# Patient Record
Sex: Female | Born: 1960 | Race: White | Hispanic: No | Marital: Married | Smoking: Current every day smoker
Health system: Southern US, Community
[De-identification: ages and names within clinical notes are randomized; demographics above are authoritative.]

## PROBLEM LIST (undated history)

## (undated) DIAGNOSIS — G40909 Epilepsy, unspecified, not intractable, without status epilepticus: Secondary | ICD-10-CM

## (undated) DIAGNOSIS — E039 Hypothyroidism, unspecified: Secondary | ICD-10-CM

## (undated) DIAGNOSIS — D649 Anemia, unspecified: Secondary | ICD-10-CM

## (undated) DIAGNOSIS — E079 Disorder of thyroid, unspecified: Secondary | ICD-10-CM

## (undated) DIAGNOSIS — F419 Anxiety disorder, unspecified: Secondary | ICD-10-CM

## (undated) DIAGNOSIS — R002 Palpitations: Secondary | ICD-10-CM

## (undated) DIAGNOSIS — J449 Chronic obstructive pulmonary disease, unspecified: Secondary | ICD-10-CM

## (undated) DIAGNOSIS — K219 Gastro-esophageal reflux disease without esophagitis: Secondary | ICD-10-CM

## (undated) HISTORY — DX: Epilepsy, unspecified, not intractable, without status epilepticus: G40.909

## (undated) HISTORY — DX: Disorder of thyroid, unspecified: E07.9

## (undated) HISTORY — DX: Palpitations: R00.2

## (undated) HISTORY — PX: OTHER SURGICAL HISTORY: SHX169

## (undated) HISTORY — PX: GASTRIC BYPASS: SHX52

## (undated) HISTORY — PX: THYROID SURGERY: SHX805

---

## 2001-07-14 ENCOUNTER — Ambulatory Visit (HOSPITAL_COMMUNITY): Admission: RE | Admit: 2001-07-14 | Discharge: 2001-07-14 | Payer: Self-pay | Admitting: Family Medicine

## 2001-07-14 ENCOUNTER — Encounter: Payer: Self-pay | Admitting: Family Medicine

## 2002-02-02 ENCOUNTER — Encounter (HOSPITAL_COMMUNITY): Admission: RE | Admit: 2002-02-02 | Discharge: 2002-03-04 | Payer: Self-pay | Admitting: *Deleted

## 2002-09-20 ENCOUNTER — Emergency Department (HOSPITAL_COMMUNITY): Admission: EM | Admit: 2002-09-20 | Discharge: 2002-09-20 | Payer: Self-pay | Admitting: *Deleted

## 2003-06-06 ENCOUNTER — Emergency Department (HOSPITAL_COMMUNITY): Admission: EM | Admit: 2003-06-06 | Discharge: 2003-06-06 | Payer: Self-pay | Admitting: Emergency Medicine

## 2003-06-07 ENCOUNTER — Ambulatory Visit (HOSPITAL_COMMUNITY): Admission: RE | Admit: 2003-06-07 | Discharge: 2003-06-07 | Payer: Self-pay | Admitting: Family Medicine

## 2003-06-08 ENCOUNTER — Ambulatory Visit (HOSPITAL_COMMUNITY): Admission: RE | Admit: 2003-06-08 | Discharge: 2003-06-08 | Payer: Self-pay | Admitting: Family Medicine

## 2003-09-29 ENCOUNTER — Ambulatory Visit (HOSPITAL_COMMUNITY): Admission: RE | Admit: 2003-09-29 | Discharge: 2003-09-29 | Payer: Self-pay | Admitting: Family Medicine

## 2004-02-21 ENCOUNTER — Ambulatory Visit (HOSPITAL_COMMUNITY): Admission: RE | Admit: 2004-02-21 | Discharge: 2004-02-21 | Payer: Self-pay | Admitting: *Deleted

## 2004-05-09 ENCOUNTER — Ambulatory Visit (HOSPITAL_COMMUNITY): Admission: RE | Admit: 2004-05-09 | Discharge: 2004-05-09 | Payer: Self-pay | Admitting: Family Medicine

## 2004-06-27 ENCOUNTER — Ambulatory Visit: Payer: Self-pay | Admitting: Internal Medicine

## 2004-06-30 ENCOUNTER — Emergency Department (HOSPITAL_COMMUNITY): Admission: EM | Admit: 2004-06-30 | Discharge: 2004-06-30 | Payer: Self-pay | Admitting: Emergency Medicine

## 2004-07-04 ENCOUNTER — Ambulatory Visit (HOSPITAL_COMMUNITY): Admission: RE | Admit: 2004-07-04 | Discharge: 2004-07-04 | Payer: Self-pay | Admitting: Family Medicine

## 2004-07-12 ENCOUNTER — Ambulatory Visit (HOSPITAL_COMMUNITY): Admission: RE | Admit: 2004-07-12 | Discharge: 2004-07-12 | Payer: Self-pay | Admitting: Internal Medicine

## 2004-07-12 ENCOUNTER — Ambulatory Visit: Payer: Self-pay | Admitting: Internal Medicine

## 2004-07-17 ENCOUNTER — Ambulatory Visit (HOSPITAL_COMMUNITY): Admission: RE | Admit: 2004-07-17 | Discharge: 2004-07-17 | Payer: Self-pay | Admitting: Obstetrics & Gynecology

## 2004-07-31 ENCOUNTER — Ambulatory Visit (HOSPITAL_COMMUNITY): Admission: RE | Admit: 2004-07-31 | Discharge: 2004-07-31 | Payer: Self-pay | Admitting: General Surgery

## 2005-05-18 ENCOUNTER — Ambulatory Visit (HOSPITAL_COMMUNITY): Admission: RE | Admit: 2005-05-18 | Discharge: 2005-05-18 | Payer: Self-pay | Admitting: *Deleted

## 2005-09-07 ENCOUNTER — Emergency Department (HOSPITAL_COMMUNITY): Admission: EM | Admit: 2005-09-07 | Discharge: 2005-09-07 | Payer: Self-pay | Admitting: Emergency Medicine

## 2006-04-16 ENCOUNTER — Emergency Department (HOSPITAL_COMMUNITY): Admission: EM | Admit: 2006-04-16 | Discharge: 2006-04-16 | Payer: Self-pay | Admitting: Emergency Medicine

## 2007-01-09 HISTORY — PX: GASTRIC BYPASS: SHX52

## 2007-02-06 ENCOUNTER — Emergency Department (HOSPITAL_COMMUNITY): Admission: EM | Admit: 2007-02-06 | Discharge: 2007-02-06 | Payer: Self-pay | Admitting: Emergency Medicine

## 2007-08-29 ENCOUNTER — Emergency Department (HOSPITAL_COMMUNITY): Admission: EM | Admit: 2007-08-29 | Discharge: 2007-08-29 | Payer: Self-pay | Admitting: Emergency Medicine

## 2007-08-30 ENCOUNTER — Emergency Department (HOSPITAL_COMMUNITY): Admission: EM | Admit: 2007-08-30 | Discharge: 2007-08-30 | Payer: Self-pay | Admitting: Emergency Medicine

## 2007-09-04 ENCOUNTER — Emergency Department (HOSPITAL_COMMUNITY): Admission: EM | Admit: 2007-09-04 | Discharge: 2007-09-04 | Payer: Self-pay | Admitting: Emergency Medicine

## 2009-02-14 ENCOUNTER — Ambulatory Visit (HOSPITAL_COMMUNITY): Admission: RE | Admit: 2009-02-14 | Discharge: 2009-02-14 | Payer: Self-pay | Admitting: Family Medicine

## 2010-03-02 ENCOUNTER — Emergency Department (HOSPITAL_COMMUNITY)
Admission: EM | Admit: 2010-03-02 | Discharge: 2010-03-02 | Disposition: A | Discharge status: Home / Self Care | Payer: BC Managed Care – PPO | Attending: Emergency Medicine | Admitting: Emergency Medicine

## 2010-03-02 ENCOUNTER — Emergency Department (HOSPITAL_COMMUNITY): Payer: BC Managed Care – PPO

## 2010-03-02 DIAGNOSIS — R109 Unspecified abdominal pain: Secondary | ICD-10-CM | POA: Insufficient documentation

## 2010-03-02 DIAGNOSIS — R0789 Other chest pain: Secondary | ICD-10-CM | POA: Insufficient documentation

## 2010-03-02 DIAGNOSIS — M545 Low back pain, unspecified: Secondary | ICD-10-CM | POA: Insufficient documentation

## 2010-03-02 DIAGNOSIS — R209 Unspecified disturbances of skin sensation: Secondary | ICD-10-CM | POA: Insufficient documentation

## 2010-03-02 LAB — BASIC METABOLIC PANEL
BUN: 13 mg/dL (ref 6–23)
Creatinine, Ser: 0.76 mg/dL (ref 0.4–1.2)
GFR calc Af Amer: >60 mL/min (ref >60)
GFR calc non Af Amer: >60 mL/min (ref >60)

## 2010-03-02 LAB — POCT CARDIAC MARKERS
CKMB, poc: 1.1 ng/mL (ref 1.0–8.0)
Myoglobin, poc: 50.6 ng/mL (ref 12–200)

## 2010-03-02 LAB — DIFFERENTIAL
Basophils Relative: 1 % (ref 0–1)
Eosinophils Relative: 6 % — ABNORMAL HIGH (ref 0–5)
Lymphocytes Relative: 22 % (ref 12–46)
Monocytes Absolute: 0.4 10*3/uL (ref 0.1–1.0)
Monocytes Relative: 7 % (ref 3–12)
Neutro Abs: 3.9 10*3/uL (ref 1.7–7.7)

## 2010-03-02 LAB — CBC
MCH: 29.1 pg (ref 26.0–34.0)
MCV: 87.1 fL (ref 78.0–100.0)
Platelets: 225 10*3/uL (ref 150–400)
RBC: 4.43 MIL/uL (ref 3.87–5.11)
RDW: 13.4 % (ref 11.5–15.5)

## 2010-03-02 LAB — URINALYSIS, ROUTINE W REFLEX MICROSCOPIC
Urine Glucose, Fasting: NEGATIVE mg/dL
Urobilinogen, UA: 0.2 mg/dL (ref 0.0–1.0)

## 2010-03-02 LAB — GLUCOSE, CAPILLARY: Glucose-Capillary: 106 mg/dL — ABNORMAL HIGH (ref 70–99)

## 2010-05-02 ENCOUNTER — Emergency Department (HOSPITAL_COMMUNITY): Payer: BC Managed Care – PPO

## 2010-05-02 ENCOUNTER — Emergency Department (HOSPITAL_COMMUNITY)
Admission: EM | Admit: 2010-05-02 | Discharge: 2010-05-02 | Disposition: A | Discharge status: Home / Self Care | Payer: BC Managed Care – PPO | Attending: Emergency Medicine | Admitting: Emergency Medicine

## 2010-05-02 DIAGNOSIS — Z9884 Bariatric surgery status: Secondary | ICD-10-CM | POA: Insufficient documentation

## 2010-05-02 DIAGNOSIS — S82899A Other fracture of unspecified lower leg, initial encounter for closed fracture: Secondary | ICD-10-CM | POA: Insufficient documentation

## 2010-05-02 DIAGNOSIS — E039 Hypothyroidism, unspecified: Secondary | ICD-10-CM | POA: Insufficient documentation

## 2010-05-02 DIAGNOSIS — W010XXA Fall on same level from slipping, tripping and stumbling without subsequent striking against object, initial encounter: Secondary | ICD-10-CM | POA: Insufficient documentation

## 2010-05-02 DIAGNOSIS — M2390 Unspecified internal derangement of unspecified knee: Secondary | ICD-10-CM | POA: Insufficient documentation

## 2010-05-02 DIAGNOSIS — G40909 Epilepsy, unspecified, not intractable, without status epilepticus: Secondary | ICD-10-CM | POA: Insufficient documentation

## 2010-05-02 DIAGNOSIS — Y998 Other external cause status: Secondary | ICD-10-CM | POA: Insufficient documentation

## 2010-05-02 DIAGNOSIS — Z79899 Other long term (current) drug therapy: Secondary | ICD-10-CM | POA: Insufficient documentation

## 2010-05-02 DIAGNOSIS — Y9301 Activity, walking, marching and hiking: Secondary | ICD-10-CM | POA: Insufficient documentation

## 2010-05-04 ENCOUNTER — Encounter: Payer: Self-pay | Admitting: Orthopedic Surgery

## 2010-05-04 ENCOUNTER — Ambulatory Visit (INDEPENDENT_AMBULATORY_CARE_PROVIDER_SITE_OTHER): Payer: BC Managed Care – PPO | Admitting: Orthopedic Surgery

## 2010-05-04 VITALS — HR 62 | Resp 16 | Ht 64.0 in | Wt 170.0 lb

## 2010-05-04 DIAGNOSIS — IMO0002 Reserved for concepts with insufficient information to code with codable children: Secondary | ICD-10-CM

## 2010-05-04 DIAGNOSIS — S8390XA Sprain of unspecified site of unspecified knee, initial encounter: Secondary | ICD-10-CM

## 2010-05-04 DIAGNOSIS — S8253XA Displaced fracture of medial malleolus of unspecified tibia, initial encounter for closed fracture: Secondary | ICD-10-CM

## 2010-05-04 NOTE — Progress Notes (Signed)
50 year old female fell down a hill on April 24.  Into the ER. X-rays were taken of the knee and ankle. No acute injury other than the medial malleolar transverse fracture line, consistent with medial malleolar fracture.  She has 6/10. Intermittent pain, which is better when she is not walking, worse when she is walking. She is on Norco 5 mg, which helps. Denies numbness or tingling. She has some pain in the RIGHT deltoid. Denies frank shoulder pain.  Review of systems heartburn, heat and cold intolerance. No chest pain or shortness of breath.  Allergic to sulfa, penicillin shellfish.  Currently, taking phenobarbital Synthroid 8 vitamins and stool softener.  Primary care physician Dr. Phillips Odor.  History of gastric bypass surgery in 2009, surgery for "tumors"  Family history heart and lung disease, diabetes, and cancer.  Patient married.  Housewife.  11th grade was completed.  General appearance medium to large, frame she's oriented x3. Mood and affect are normal. She is ambulating with a knee immobilizer an obvious limp, favoring the RIGHT lower extremity. Her upper extremities show no contracture subluxation, atrophy, tremor or swelling.  The RIGHT knee is nontender with stable ligaments. There is no effusion is 90 of knee flexion after being in knee immobilizer.  She's tender over the medial malleolus with swelling of the ankle. She has normal ankle range of motion. Ankle is stable. There is an abrasion over the skin of the ankle.  Skin over the knee is normal.  Pulse and temperature of the RIGHT lower extremity are normal. Sensation is normal. Pathologic reflexes none.  Coordination and balance normal.  Hospital x-rays again are negative. Disagrees with a report as dictated.  Impression sprain, RIGHT knee seems to be improving recommend hinged knee brace.  Medial malleolar fracture.  Recommend Aircast for weightbearing x-ray in 4 weeks expect 8 weeks to heal.  Patient  will follow up in 4 weeks for x-ray out of plaster and reevaluation of her RIGHT knee.

## 2010-05-05 ENCOUNTER — Telehealth: Payer: Self-pay | Admitting: *Deleted

## 2010-05-05 NOTE — Telephone Encounter (Signed)
Called stating her neck and head is hurting, feeling nauseated, advised to call PCP, we have not seen her for these problems before, advised Dr. Rexene Edison would not be in until Monday afternoon

## 2010-05-26 NOTE — Op Note (Signed)
NAME:  Gabriella Hodges, Gabriella Hodges              ACCOUNT NO.:  [id number]   MEDICAL RECORD NO.:  [id number]          PATIENT TYPE:  AMB   LOCATION:  DAY                           FACILITY:  [facility]   PHYSICIAN:  Dalia Heading, M.D.  DATE OF BIRTH:  09/20/1960   DATE OF PROCEDURE:  07/31/2004  DATE OF DISCHARGE:                                 OPERATIVE REPORT   PREOPERATIVE DIAGNOSIS:  Incarcerated umbilical hernia.   POSTOPERATIVE DIAGNOSIS:  Incarcerated umbilical hernia.   PROCEDURE:  Incarcerated umbilical herniorrhaphy.   SURGEON:  Dalia Heading, M.D.   ANESTHESIA:  General endotracheal.   INDICATIONS:  The patient is a 50 year old morbidly obese white female who  is referred for treatment of an incarcerated umbilical hernia.  Risks and  benefits of procedure including bleeding, infection, recurrence of the  hernia were fully explained to the patient, who gave informed consent.   PROCEDURE NOTE:  The patient was placed in supine position.  After induction  of general endotracheal anesthesia, the abdomen was prepped and draped in  the usual sterile technique with Betadine.  Surgical site confirmation  performed.   An infraumbilical incision was made down to the base of the umbilicus. The  umbilicus was freed away from the umbilical hernia sac.  Omentum was noted  to be incarcerated within the hernia.  The hernia itself measured only  approximately 2-3 cm at its greatest diameter.  Part of the omentum was  incarcerated was resected. The remaining omentum was then reduced. The  hernia defect was then closed using O Ethibond interrupted sutures in a two-  layer fashion.  The subcutaneous base of the umbilicus was secured to the  fascia using a 2-0 Vicryl interrupted suture.  The subcutaneous layer was  reapproximated using 2-0 and 3-0 Vicryl interrupted sutures.  Skin was  closed using staples, and 0.5% Sensorcaine was instilled in the surrounding  wound.  Betadine ointment and dry  sterile dressing were applied.   All tape and needle counts were correct an the end of the procedure.  The  patient was extubated in the operating room and went back to the recovery  room awake in stable condition.   COMPLICATIONS:  None.   SPECIMEN:  None.   BLOOD LOSS:  Minimal.       MAJ/MEDQ  D:  07/31/2004  T:  07/31/2004  Job:  [id number]   cc:   Patrica Duel, M.D.  [address], [address]  [place]  [place] [address]  Fax: [phone]

## 2010-05-26 NOTE — Op Note (Signed)
NAME:  Gabriella Hodges, Gabriella Hodges NO.:  [id number]   MEDICAL RECORD NO.:  [id number]                  PATIENT TYPE:   LOCATION:                                       FACILITY:   PHYSICIAN:  Vida Roller, M.D.                DATE OF BIRTH:  Sep 02, 1960   DATE OF PROCEDURE:  02/02/2002  DATE OF DISCHARGE:                                  PROCEDURE NOTE   REFERRING PHYSICIAN:  Patrica Duel, M.D.   INDICATIONS FOR PROCEDURE:  This is a morbidly obese white female with  severe shortness of breath with exertion.   IMPRESSION:  1. Nondiagnostic stress myocardial perfusion imaging study with Adenosine.  2. Mildly abnormal nuclear perfusion imaging study with a fixed inferior     defect from base to apex, moderate in severity likely representing     diaphragmatic attenuation.  3. Preserved left ventricular ejection fraction at 57%.   DOSE:  1. 10 millicuries of technetium 99 Cardiolite was injected at rest.  2. 30 millicuries of technetium 1 M Cardiolite was injected at peak     pharmacologic stress.   PROCEDURE:  Following rest induction of low dose technetium with 99 tracer.  SPECT imaging of the chest was performed just prior to the achievement of  peak pharmacologic stress during the infusion of Adenosine over a four  minute protocol during stress EKG examination.  The patient was administered  technetium 18 M Cardiolite intravenously.  Post stress imaging of the chest  was performed using single photon emission computed tomography with wall  motion assessment and gating to obtain a left ventricular ejection fraction.   FINDINGS:  Stress images of the chest reveal a moderate defect in the  inferior wall from base to apex as well as an apical defect which is mild,  both of which are fixed and likely represent attenuation defect either from  the abdominal pannus or the diaphragm or both.  Post stress images of the  chest gated reveal an ejection fraction  of 57% with no obvious wall motion  abnormalities which lends further credence to the likelihood this is  attenuation artifact and not actual infarct.  There is no evidence of  ischemia.   STRESS TESTING:  The patient was given an infusion of Adenosine.  She  received a total of 120 mg infused over four minutes.  She experienced no  significant symptoms other than mild chest tightness and had no ST-T wave  changes associated with the infusion.  Her blood pressure response showed a  resting systolic blood pressure of 122 which did not change.  However, her  heart rate went from 83 at rest to 116 at peak pharmacologic stress.    IMPRESSION:  This is a patient who is 5 feet 5 inches tall and weighs 338  pounds and therefore has a significant likelihood for having attenuation  even with high energy tracer  technetium.  The wall motion assessment lends a  great deal of credence to the likelihood that this is not ischemia or  infarction, however, these etiology cannot be ruled out.                                               Vida Roller, M.D.    JH/MEDQ  D:  02/03/2002  T:  02/03/2002  Job:  [id number]

## 2010-05-26 NOTE — H&P (Signed)
NAME:  Gabriella Hodges, Gabriella Hodges              ACCOUNT NO.:  [id number]   MEDICAL RECORD NO.:  [id number]          PATIENT TYPE:  AMB   LOCATION:  DAY                           FACILITY:  [facility]   PHYSICIAN:  Dalia Heading, M.D.  DATE OF BIRTH:  [birth year]   DATE OF ADMISSION:  DATE OF DISCHARGE:  LH                                HISTORY & PHYSICAL   CHIEF COMPLAINT:  Umbilical hernia.   HISTORY OF PRESENT ILLNESS:  The patient is a 50 year old morbidly obese  white female who is referred for evaluation and treatment of an umbilical  hernia.  The pain radiates down to the right lower flank.  She had a  colonoscopy five years ago by Dr. Karilyn Cota that was reportedly unremarkable.  She had a recent CT scan of the abdomen which only showed an umbilical  hernia.   PAST MEDICAL HISTORY:  1.  Epilepsy.  2.  Hypothyroidism.   PAST SURGICAL HISTORY:  1.  Cholecystectomy.  2.  Thyroid surgery.   CURRENT MEDICATIONS:  1.  Phenobarbital.  2.  Synthroid.  3.  Oxycodone.   ALLERGIES:  No known drug allergies.   SOCIAL HISTORY:  The patient denies any significant alcohol use.  She does  smoke on occasion.   PHYSICAL EXAMINATION:  GENERAL APPEARANCE:  The patient is a morbidly obese  white female in no acute distress.  She is afebrile and in no acute  distress.  VITAL SIGNS:  Afebrile and vital signs are stable.  LUNGS:  Clear to auscultation with good breath sounds bilaterally.  HEART:  Regular rate and rhythm without S3, S4 or murmurs.  ABDOMEN:  Soft, nondistended.  She does have an umbilical hernia which is  difficult to reduce.  No hepatosplenomegaly or masses are noted.  No  inguinal hernias are noted.   IMPRESSION:  Incarcerated umbilical hernia.   PLAN:  The patient is scheduled to undergo an umbilical herniorrhaphy on  July 31, 2004.  Risks, benefits of the procedure including bleeding,  infection and recurrence of the hernia as well as recurrence of the right  lower quadrant  abdominal pain were fully explained to the patient.  Gave  informed consent.       MAJ/MEDQ  D:  07/27/2004  T:  07/27/2004  Job:  [id number]   cc:   Patrica Duel, M.D.  [address]  [place]  [place] [address]  Fax: [phone]

## 2010-05-26 NOTE — Consult Note (Signed)
NAME:  Gabriella Hodges, Gabriella Hodges              ACCOUNT NO.:  [id number]   MEDICAL RECORD NO.:  [id number]          PATIENT TYPE:  AMB   LOCATION:  DAY                           FACILITY:  [facility]   PHYSICIAN:  Lionel December, M.D.    DATE OF BIRTH:  05/11/1960   DATE OF CONSULTATION:  06/27/2004  DATE OF DISCHARGE:                                   CONSULTATION   CHIEF COMPLAINT:  Abdominal pain, nausea.   REFERRING PHYSICIAN:  Patrica Duel, M.D.   HISTORY OF PRESENT ILLNESS:  Gabriella Hodges is a 50 year old morbidly obese Caucasian  female, patient of Dr. Patrica Duel, who presents today for further  evaluation of above stated symptoms.  In addition, she states she has had  dysphagia for many years.  She has difficulty swallowing solids and liquids.  She feels like it is getting caught in her throat.  She notes it has been  there since she has had partial thyroidectomy.  She feels like she has to  clear her throat all the time.  She also complains of hoarseness.  She  denies any typical heartburn symptoms.  She has nausea, but no vomiting.  She has epigastric burning, especially postprandially.  She usually has two  to three liquid to watery bowel movements a day.  Stools are usually yellow.  No melena or rectal bleeding.  She has been on Nexium and Aciphex before  which helped her nausea, but she said she cannot afford the medication.  She  complains of intermittent right-sided abdominal pain, but it unsure of any  modifying or alleviating factors.  She feels like her right abdomen is  larger than the left.  She had an upper GI series on May 09, 2004, which  revealed a moderate sized hiatal hernia with episodes of spontaneous  gastroesophageal reflux.  She had an abdominal ultrasound which revealed  status post cholecystectomy, probable mild fatty infiltration of the liver.  Otherwise, not well seen due to her body habitus.  She had a CBC, LFT's, and  met-7, which were all normal.  H. pylori serologies  were negative at 0.9.   CURRENT MEDICATIONS:  1.  Synthroid 0.05 mg daily.  2.  Phenobarbital 100 mg t.i.d.  3.  Tylenol Arthritis p.r.n.  4.  Nexium 40 mg daily when she has samples.   ALLERGIES:  TYLENOL SINUS.   PAST MEDICAL HISTORY:  1.  History of multiple thyroid nodules, status post left lobectomy and 2/3      of her right thyroid lobe removed.  She is on supplements now.  2.  Gastroesophageal reflux disease with moderate sized hiatal hernia.  3.  Seizure disorder.  4.  Fatty infiltration of the liver.  5.  She had a colonoscopy in 1996, which revealed hemorrhoids.  EGD in 1982,      revealed bile gastritis.  6.  Cholecystectomy in 1979.  7.  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   FAMILY HISTORY:  Mother has thyroid cancer.  Father had heart disease.  She  has a sister with breast cancer.  No family history of colorectal cancer.   SOCIAL HISTORY:  She has a 19 year old son and a 32-year-old daughter.  She  is married.  She quit smoking eight years ago.   REVIEW OF SYSTEMS:  See HPI for GI.  CARDIOPULMONARY:  Denies any chest pain  or shortness of breath.  CONSTITUTIONAL:  Denies any weight loss.   PHYSICAL EXAMINATION:  VITAL SIGNS:  Weight, according to the patient, is  342.  Height is 5 feet 5 inches.  Temperature 98, blood pressure 124/68,  pulse 74.  GENERAL:  A pleasant, morbidly obese Caucasian female in no acute distress.  SKIN:  Warm and dry, no jaundice.  HEENT:  Conjunctivae are pink, sclerae nonicteric.  Oropharyngeal mucosa  moist and pink.  No lesions, erythema, or exudates.  NECK:  No lymphadenopathy or thyromegaly.  She has a prominent scar in the  anterior cervical neck.  CHEST:  Lungs are clear to auscultation.  CARDIAC:  Regular rate and rhythm, no murmurs, rubs, or gallops.  ABDOMEN:  Positive bowel sounds, obese, but symmetrical.  She has mild  epigastric tenderness to deep palpation.  No organomegaly or masses   appreciated, but very limited due to body habitus.  EXTREMITIES:  1+ pitting edema bilaterally around the calves.   IMPRESSION:  Gabriella Hodges is a 50 year old lady with chronic dysphagia both to  solids and liquids since her partial thyroidectomy 10 years ago.  Recent  upper gastrointestinal series did not reveal any esophageal strictures or  peptic ulcer disease.  She does have a moderate sized hiatal hernia.  It is  possible that her dysphagia is due to her prior surgery.  She could also  have this sensation due to poorly controlled acid reflux disease.  She is  intermittently on proton pump inhibitor therapy.  I offered her today an  upper endoscopy for further evaluation of her upper gastrointestinal tract  and to exclude small erosions as well which may not be picked up on upper  gastrointestinal series.  She may benefit from esophageal dilatation, but  she is aware that there is a possibility that this makes no difference.  She  would like to give it a try, however.  In addition, she has epigastric right-  sided abdominal pain which is chronic in nature.  The right-sided abdominal  pain does not seem to always be related to meals as is the epigastric pain.  She also has chronic diarrhea, and Dr. Karilyn Cota previously felt she had  irritable bowel syndrome.  It may be that much of her abdominal pain is  explained by irritable bowel syndrome.   PLAN:  1.  Esophagogastroduodenoscopy with possible dilatation in the near future.  2.  Zegreg 40 mg daily, #20 samples.  3.  Trial of ____________one sublingual q.a.c. and q.h.s., #60 in samples      and prescription for #120 with one refill.  4.  If above medical regimen and esophagogastroduodenoscopy does not explain      her abdominal pain, it may be that she needs to have a CT scan of the      abdomen and pelvis with intravenous and oral contrast.  She is just      under the weight limit for the study, as per radiology.       LL/MEDQ  D:   06/27/2004  T:  06/27/2004  Job:  [id number]

## 2010-05-26 NOTE — Op Note (Signed)
NAME:  Gabriella Hodges, Gabriella Hodges              ACCOUNT NO.:  [id number]   MEDICAL RECORD NO.:  [id number]          PATIENT TYPE:  AMB   LOCATION:  DAY                           FACILITY:  [facility]   PHYSICIAN:  Lionel December, M.D.    DATE OF BIRTH:  Feb 12, 1960   DATE OF PROCEDURE:  DATE OF DISCHARGE:                                 OPERATIVE REPORT   PROCEDURE:  Esophagogastroduodenoscopy with esophageal dilation.   INDICATIONS:  [name] is a 50 year old Caucasian female who presents with  dysphagia to solids as well as liquids she apparently has had for 10 years  since her thyroid surgery. She has a hiatal hernia. She also complains of  epigastric and a right-sided abdominal pain. She is undergoing  diagnostic/therapeutic procedure. The procedure is reviewed with the patient  and informed consent was obtained.   Please note that she had ultrasound recently which revealed fatty liver but  bile duct was unremarkable. She is status post cholecystectomy over 25 years  ago.   PREOP MEDICATION:  Cetacaine spray for pharyngeal topical anesthesia,  Demerol 50 mg IV, Versed 8 mg IV in divided dose.   FINDINGS:  Procedure performed in endoscopy suite. The patient's vital signs  and O2 sat were monitored during the procedure and remained stable. The  patient was placed in the left lateral decubitus position and Olympus  videoscope was passed through oropharynx without any difficulty into  esophagus.   ESOPHAGUS:  The mucosa of the esophagus was normal throughout. GE junction  was at 40 cm from the incisors. No ring, stricture or hernia was noted.   STOMACH:  It was empty and distended very well with insufflation. The folds  of the proximal stomach were normal. Examination of mucosa at body, antrum,  pyloric channel as well as angularis, fundus and cardia was normal.   DUODENUM:  Bulbar mucosa was normal. The scope was passed to the second part  of the duodenum where mucosa and folds were normal.  Endoscope was withdrawn.   The esophagus was dilated by passing a 56-French Maloney dilator to full  insertion. As the dilator was withdrawn, the endoscope was passed again and  there was no mucosal injury noted in the esophagus.   The patient tolerated the procedure well.   FINAL DIAGNOSIS:  Normal esophagogastroduodenoscopy. She has a sliding  hiatal hernia which was not apparent on today's exam.   Esophagus dilated by passing a 56-French Maloney dilator but did not result  in a mucosal injury to esophagus.   RECOMMENDATIONS:  She will continue antireflux measures and Nexium as  before. If she remains with dysphagia, will consider esophageal manometry  and perhaps multichannel impedance study.       NR/MEDQ  D:  07/12/2004  T:  07/12/2004  Job:  [id number]   cc:   Patrica Duel, M.D.  [address], [address]  [place]  [place] [address]  Fax: [phone]

## 2010-06-01 ENCOUNTER — Ambulatory Visit: Payer: BC Managed Care – PPO | Admitting: Orthopedic Surgery

## 2010-06-08 ENCOUNTER — Ambulatory Visit: Payer: BC Managed Care – PPO | Admitting: Orthopedic Surgery

## 2010-06-29 ENCOUNTER — Ambulatory Visit (INDEPENDENT_AMBULATORY_CARE_PROVIDER_SITE_OTHER): Payer: BC Managed Care – PPO | Admitting: Orthopedic Surgery

## 2010-06-29 DIAGNOSIS — S82899A Other fracture of unspecified lower leg, initial encounter for closed fracture: Secondary | ICD-10-CM

## 2010-06-29 NOTE — Progress Notes (Signed)
Ankle x-rays to evaluate ankle fracture.  Previously noted transverse medial malleolar fracture seems to have resolved into a stable position with perhaps fibrous union.  Clinical correlation recommended.  Impression healed medial malleolar avulsion fracture

## 2010-06-29 NOTE — Progress Notes (Signed)
50 year old female fell down a hill on April 24. This is week 8  In the ER. X-rays were taken of the knee and ankle. No acute injury other than the medial malleolar transverse fracture line, consistent with medial malleolar fracture.  Also complained of right knee pain, knee pain is better  Right ankle:   Here today for followup visit and ankle x-rays   Treatment  hinged knee brace.  Medial malleolar fracture.  Recommend Aircast for weightbearing x-ray in 4 weeks expect 8 weeks to heal.  Exam right ankle medial malleolar tenderness   X:rays :

## 2010-09-10 ENCOUNTER — Encounter (HOSPITAL_COMMUNITY): Payer: Self-pay | Admitting: *Deleted

## 2010-09-10 ENCOUNTER — Emergency Department (HOSPITAL_COMMUNITY)
Admission: EM | Admit: 2010-09-10 | Discharge: 2010-09-10 | Disposition: A | Discharge status: Home / Self Care | Payer: BC Managed Care – PPO | Attending: Emergency Medicine | Admitting: Emergency Medicine

## 2010-09-10 ENCOUNTER — Other Ambulatory Visit: Payer: Self-pay

## 2010-09-10 DIAGNOSIS — I498 Other specified cardiac arrhythmias: Secondary | ICD-10-CM | POA: Insufficient documentation

## 2010-09-10 DIAGNOSIS — K3189 Other diseases of stomach and duodenum: Secondary | ICD-10-CM | POA: Insufficient documentation

## 2010-09-10 DIAGNOSIS — R1013 Epigastric pain: Secondary | ICD-10-CM | POA: Insufficient documentation

## 2010-09-10 DIAGNOSIS — K219 Gastro-esophageal reflux disease without esophagitis: Secondary | ICD-10-CM | POA: Insufficient documentation

## 2010-09-10 LAB — COMPREHENSIVE METABOLIC PANEL
ALT: 15 U/L (ref 0–35)
AST: 22 U/L (ref 0–37)
Albumin: 3.7 g/dL (ref 3.5–5.2)
Alkaline Phosphatase: 100 U/L (ref 39–117)
Chloride: 101 mEq/L (ref 96–112)
Potassium: 3.7 mEq/L (ref 3.5–5.1)
Total Bilirubin: 0.3 mg/dL (ref 0.3–1.2)

## 2010-09-10 LAB — CBC
MCH: 29.7 pg (ref 26.0–34.0)
MCHC: 33.3 g/dL (ref 30.0–36.0)
Platelets: 184 10*3/uL (ref 150–400)
RDW: 13.1 % (ref 11.5–15.5)

## 2010-09-10 MED ORDER — ONDANSETRON HCL 4 MG/2ML IJ SOLN
4.0000 mg | Freq: Once | INTRAMUSCULAR | Status: AC
  Administered 2010-09-10: 4 mg via INTRAVENOUS
  Filled 2010-09-10: qty 2

## 2010-09-10 MED ORDER — PANTOPRAZOLE SODIUM 20 MG PO TBEC: 40.0000 mg | DELAYED_RELEASE_TABLET | Freq: Every day | ORAL | Status: DC

## 2010-09-10 MED ORDER — PROMETHAZINE HCL 25 MG PO TABS: 25.0000 mg | ORAL_TABLET | Freq: Four times a day (QID) | ORAL | Status: AC | PRN

## 2010-09-10 NOTE — ED Notes (Signed)
Pt c/o pain in the center of her chest x 1 hour. Pt also c/o nausea. Denies shortness of breath.

## 2010-09-10 NOTE — ED Provider Notes (Addendum)
History   Scribed for Nicholes Stairs, MD, the patient was seen in room APA14/APA14. This chart was scribed by Clarita Crane. This patient's care was started at 3:01PM.  CSN: [id number] Arrival date & time: 09/10/2010  2:09 PM  Chief Complaint  Patient presents with  . Chest Pain   HPI Gabriella Hodges is a 50 y.o. female who presents to the Emergency Department complaining of intermittent stabbing sub-sternal chest pain radiating to right neck and shoulder with associated mild nausea onset 1 hour ago but chest pain is currently resolved. Patient states she had an episode of diaphoresis while in the ED waiting room during which chest pain resolved but nausea continued. Patient notes pain was mildly relieved with belching and aggravated by nothing. Denies SOB. Cough, fever, chills, swelling of extremities. Denies previous history of similar symptoms and recent surgeries.  Reports her father with h/o CHF and multiple Strokes with first stroke occuring in his 34's. Patient is a never smoker and LMP was 6 years ago.   HPI ELEMENTS: Location: substernal chest Onset: 1 hour ago Duration: currently resolved  Timing: intermittent Quality: stabbing  Modifying factors: aggravated by nothing, relieved with belching  Context:  as above  Associated symptoms: +Nausea, diaphoresis. Denies SOB. Cough, fever, chills, swelling of extremities.     PAST MEDICAL HISTORY:  Past Medical History  Diagnosis Date  . Epilepsy   . Thyroid disease     PAST SURGICAL HISTORY:  Past Surgical History  Procedure Date  . Tumors   . Gastric bypass     MEDICATIONS:  Previous Medications   DOCUSATE CALCIUM (STOOL SOFTENER PO)    Take by mouth.     HYDROCODONE-ACETAMINOPHEN (NORCO) 5-325 MG PER TABLET    Take 1 tablet by mouth every 4 (four) hours as needed.     LEVOTHYROXINE SODIUM (SYNTHROID PO)    Take by mouth.     MULTIPLE VITAMIN (MULTIVITAMIN) TABLET    Take 1 tablet by mouth daily.     PHENOBARBITAL PO     Take by mouth.       ALLERGIES:  Allergies as of 09/10/2010 - Review Complete 09/10/2010  Allergen Reaction Noted  . Nsaids  05/04/2010  . Penicillins  05/04/2010  . Shellfish-derived products  05/04/2010  . Sulfa antibiotics  05/04/2010     FAMILY HISTORY:  Family History  Problem Relation Age of Onset  . Heart disease    . Lung disease    . Cancer    . Diabetes       SOCIAL HISTORY: History   Social History  . Marital Status: Married    Spouse Name: N/A    Number of Children: N/A  . Years of Education: 11th grade   Occupational History  . none    Social History Main Topics  . Smoking status: Never Smoker   . Smokeless tobacco: None  . Alcohol Use: No  . Drug Use: No  . Sexually Active: None   Other Topics Concern  . None   Social History Narrative  . None      Review of Systems 10 Systems reviewed and are negative for acute change except as noted in the HPI.  Physical Exam  BP 136/65  Pulse 56  Temp(Src) 97.4 F (36.3 C) (Oral)  Resp 15  Ht 5\' 5"  (1.651 m)  Wt 170 lb (77.111 kg)  BMI 28.29 kg/m2  SpO2 96%  Physical Exam  Nursing note and vitals reviewed. Constitutional: She is oriented  to person, place, and time. She appears well-developed and well-nourished. No distress.  HENT:  Head: Normocephalic and atraumatic.  Eyes: Conjunctivae are normal. Pupils are equal, round, and reactive to light.  Neck: Neck supple. No JVD present.       No carotid bruits.   Cardiovascular: Regular rhythm and intact distal pulses.  Bradycardia present.  Exam reveals no gallop and no friction rub.   No murmur heard. Pulmonary/Chest: Effort normal and breath sounds normal. No respiratory distress. She has no wheezes. She has no rales.  Abdominal: Soft. Bowel sounds are normal. She exhibits no distension. There is tenderness.       Mild epigastric tenderness.   Musculoskeletal: Normal range of motion. She exhibits no edema.  Neurological: She is alert and  oriented to person, place, and time. No sensory deficit.  Skin: Skin is warm and dry.  Psychiatric: She has a normal mood and affect. Her behavior is normal.    Date: 09/10/2010  Rate: 49  Rhythm: sinus bradycardia  QRS Axis: normal  Intervals: normal  ST/T Wave abnormalities: normal  Conduction Disutrbances:none  Narrative Interpretation:   Old EKG Reviewed: none available  ED Course  Procedures  OTHER DATA REVIEWED: Nursing notes, vital signs, and past medical records reviewed. Lab results reviewed and considered Imaging results reviewed and considered  DIAGNOSTIC STUDIES: Oxygen Saturation is 100% on room air, normal by my interpretation.    LABS / RADIOLOGY: Results for orders placed during the hospital encounter of 09/10/10  CBC      Component Value Range   WBC 9.8  4.0 - 10.5 (K/uL)   RBC 4.41  3.87 - 5.11 (MIL/uL)   Hemoglobin 13.1  12.0 - 15.0 (g/dL)   HCT 14.7  82.9 - 56.2 (%)   MCV 89.1  78.0 - 100.0 (fL)   MCH 29.7  26.0 - 34.0 (pg)   MCHC 33.3  30.0 - 36.0 (g/dL)   RDW 13.0  86.5 - 78.4 (%)   Platelets 184  150 - 400 (K/uL)  COMPREHENSIVE METABOLIC PANEL      Component Value Range   Sodium 136  135 - 145 (mEq/L)   Potassium 3.7  3.5 - 5.1 (mEq/L)   Chloride 101  96 - 112 (mEq/L)   CO2 28  19 - 32 (mEq/L)   Glucose, Bld 104 (*) 70 - 99 (mg/dL)   BUN 15  6 - 23 (mg/dL)   Creatinine, Ser 6.96  0.50 - 1.10 (mg/dL)   Calcium 9.2  8.4 - 29.5 (mg/dL)   Total Protein 6.6  6.0 - 8.3 (g/dL)   Albumin 3.7  3.5 - 5.2 (g/dL)   AST 22  0 - 37 (U/L)   ALT 15  0 - 35 (U/L)   Alkaline Phosphatase 100  39 - 117 (U/L)   Total Bilirubin 0.3  0.3 - 1.2 (mg/dL)   GFR calc non Af Amer >60  >60 (mL/min)   GFR calc Af Amer >60  >60 (mL/min)    PROCEDURES:  ED COURSE / COORDINATION OF CARE: Orders Placed This Encounter  Procedures  . CBC  . Comprehensive metabolic panel  . ED EKG     MDM: Differential Diagnosis:GERD, dyspepsia.  I doubt she has acs.  She does  not smoke.  She has no risk factors for acs.  Her pain was sharp and intermittent.  No associated sob.  Sxs resolved in ed and ecg neg.     PLAN: Discharge Home -eval for GI etio. I  do not think her sxs are c/w acs.  Furthermore, sxs were for < 2 hours.  CEs not indicated.  Would not be + if there was suspicion for acs.   The patient is to return the emergency department if there is any worsening of symptoms. I have reviewed the discharge instructions with the patient/family  CONDITION ON DISCHARGE: Stable   MEDICATIONS GIVEN IN THE E.D.  Medications  ondansetron (ZOFRAN) injection 4 mg (not administered)     I personally performed the services described in this documentation, which was scribed in my presence. The recorded information has been reviewed and considered. Nicholes Stairs, MD     Nicholes Stairs, MD 09/10/10 1628  Nicholes Stairs, MD 09/10/10 [phone]

## 2010-09-28 LAB — DIFFERENTIAL
Basophils Absolute: 0
Basophils Relative: 0
Monocytes Absolute: 0.2
Neutro Abs: 5.5
Neutrophils Relative %: 90 — ABNORMAL HIGH

## 2010-09-28 LAB — CBC
Hemoglobin: 13.8
MCHC: 34.7
RDW: 13.3

## 2010-09-28 LAB — BASIC METABOLIC PANEL
BUN: 11
CO2: 21
Calcium: 8.6
Creatinine, Ser: 0.96
Glucose, Bld: 106 — ABNORMAL HIGH

## 2010-10-13 ENCOUNTER — Other Ambulatory Visit (HOSPITAL_COMMUNITY): Payer: Self-pay | Admitting: Family Medicine

## 2010-10-13 DIAGNOSIS — Z139 Encounter for screening, unspecified: Secondary | ICD-10-CM

## 2010-10-19 ENCOUNTER — Ambulatory Visit (HOSPITAL_COMMUNITY)
Admission: RE | Admit: 2010-10-19 | Discharge: 2010-10-19 | Disposition: A | Discharge status: Home / Self Care | Payer: BC Managed Care – PPO | Source: Ambulatory Visit | Attending: Family Medicine | Admitting: Family Medicine

## 2010-10-19 DIAGNOSIS — Z1231 Encounter for screening mammogram for malignant neoplasm of breast: Secondary | ICD-10-CM | POA: Insufficient documentation

## 2010-10-19 DIAGNOSIS — Z139 Encounter for screening, unspecified: Secondary | ICD-10-CM

## 2011-12-19 ENCOUNTER — Emergency Department (HOSPITAL_COMMUNITY): Payer: BC Managed Care – PPO

## 2011-12-19 ENCOUNTER — Observation Stay (HOSPITAL_COMMUNITY): Payer: BC Managed Care – PPO

## 2011-12-19 ENCOUNTER — Encounter (HOSPITAL_COMMUNITY): Payer: Self-pay

## 2011-12-19 ENCOUNTER — Inpatient Hospital Stay (HOSPITAL_COMMUNITY)
Admission: EM | Admit: 2011-12-19 | Discharge: 2011-12-20 | DRG: 065 | Disposition: A | Discharge status: Home / Self Care | Payer: BC Managed Care – PPO | Attending: Internal Medicine | Admitting: Internal Medicine

## 2011-12-19 DIAGNOSIS — Z9884 Bariatric surgery status: Secondary | ICD-10-CM

## 2011-12-19 DIAGNOSIS — R0789 Other chest pain: Secondary | ICD-10-CM

## 2011-12-19 DIAGNOSIS — R079 Chest pain, unspecified: Secondary | ICD-10-CM | POA: Diagnosis present

## 2011-12-19 DIAGNOSIS — Z886 Allergy status to analgesic agent status: Secondary | ICD-10-CM

## 2011-12-19 DIAGNOSIS — R112 Nausea with vomiting, unspecified: Secondary | ICD-10-CM | POA: Diagnosis present

## 2011-12-19 DIAGNOSIS — E039 Hypothyroidism, unspecified: Secondary | ICD-10-CM | POA: Diagnosis present

## 2011-12-19 DIAGNOSIS — G40909 Epilepsy, unspecified, not intractable, without status epilepticus: Secondary | ICD-10-CM | POA: Diagnosis present

## 2011-12-19 DIAGNOSIS — H9319 Tinnitus, unspecified ear: Secondary | ICD-10-CM | POA: Diagnosis present

## 2011-12-19 DIAGNOSIS — F17200 Nicotine dependence, unspecified, uncomplicated: Secondary | ICD-10-CM | POA: Diagnosis present

## 2011-12-19 DIAGNOSIS — Z91013 Allergy to seafood: Secondary | ICD-10-CM

## 2011-12-19 DIAGNOSIS — Z79899 Other long term (current) drug therapy: Secondary | ICD-10-CM

## 2011-12-19 DIAGNOSIS — Z882 Allergy status to sulfonamides status: Secondary | ICD-10-CM

## 2011-12-19 DIAGNOSIS — R42 Dizziness and giddiness: Principal | ICD-10-CM | POA: Diagnosis present

## 2011-12-19 DIAGNOSIS — Z880 Allergy status to penicillin: Secondary | ICD-10-CM

## 2011-12-19 HISTORY — DX: Hypothyroidism, unspecified: E03.9

## 2011-12-19 HISTORY — DX: Anxiety disorder, unspecified: F41.9

## 2011-12-19 LAB — CBC WITH DIFFERENTIAL/PLATELET
Basophils Absolute: 0.1 10*3/uL (ref 0.0–0.1)
Basophils Relative: 1 % (ref 0–1)
Hemoglobin: 13.1 g/dL (ref 12.0–15.0)
MCHC: 34.4 g/dL (ref 30.0–36.0)
Monocytes Relative: 6 % (ref 3–12)
Neutro Abs: 4 10*3/uL (ref 1.7–7.7)
Neutrophils Relative %: 70 % (ref 43–77)

## 2011-12-19 LAB — BASIC METABOLIC PANEL
BUN: 16 mg/dL (ref 6–23)
Chloride: 104 mEq/L (ref 96–112)
GFR calc Af Amer: >90 mL/min (ref >90)
Potassium: 3.7 mEq/L (ref 3.5–5.1)
Sodium: 138 mEq/L (ref 135–145)

## 2011-12-19 LAB — HEPATIC FUNCTION PANEL
ALT: 13 U/L (ref 0–35)
Albumin: 3.6 g/dL (ref 3.5–5.2)
Alkaline Phosphatase: 88 U/L (ref 39–117)
Total Bilirubin: 0.2 mg/dL — ABNORMAL LOW (ref 0.3–1.2)

## 2011-12-19 LAB — PHENOBARBITAL LEVEL: Phenobarbital: 3.1 ug/mL — ABNORMAL LOW (ref 15.0–40.0)

## 2011-12-19 LAB — TROPONIN I: Troponin I: <0.3 ng/mL (ref <0.30)

## 2011-12-19 MED ORDER — DIAZEPAM 5 MG PO TABS
5.0000 mg | ORAL_TABLET | Freq: Three times a day (TID) | ORAL | Status: DC
  Administered 2011-12-19 (×2): 5 mg via ORAL
  Filled 2011-12-19 (×2): qty 1

## 2011-12-19 MED ORDER — GADOBENATE DIMEGLUMINE 529 MG/ML IV SOLN
18.0000 mL | Freq: Once | INTRAVENOUS | Status: AC | PRN
  Administered 2011-12-19: 18 mL via INTRAVENOUS

## 2011-12-19 MED ORDER — ALBUTEROL SULFATE (5 MG/ML) 0.5% IN NEBU: 2.5000 mg | INHALATION_SOLUTION | RESPIRATORY_TRACT | Status: DC | PRN

## 2011-12-19 MED ORDER — ONDANSETRON HCL 4 MG/2ML IJ SOLN
4.0000 mg | Freq: Once | INTRAMUSCULAR | Status: AC
  Administered 2011-12-19: 4 mg via INTRAVENOUS
  Filled 2011-12-19: qty 2

## 2011-12-19 MED ORDER — ONDANSETRON HCL 4 MG/2ML IJ SOLN: 4.0000 mg | Freq: Four times a day (QID) | INTRAMUSCULAR | Status: DC | PRN

## 2011-12-19 MED ORDER — SODIUM CHLORIDE 0.9 % IV SOLN
Freq: Once | INTRAVENOUS | Status: AC
  Administered 2011-12-19: 1000 mL via INTRAVENOUS

## 2011-12-19 MED ORDER — ENOXAPARIN SODIUM 40 MG/0.4ML ~~LOC~~ SOLN
40.0000 mg | SUBCUTANEOUS | Status: DC
  Administered 2011-12-19 – 2011-12-20 (×2): 40 mg via SUBCUTANEOUS
  Filled 2011-12-19 (×2): qty 0.4

## 2011-12-19 MED ORDER — MECLIZINE HCL 12.5 MG PO TABS: 25.0000 mg | ORAL_TABLET | Freq: Three times a day (TID) | ORAL | Status: DC | PRN

## 2011-12-19 MED ORDER — DOCUSATE SODIUM 100 MG PO CAPS
200.0000 mg | ORAL_CAPSULE | Freq: Two times a day (BID) | ORAL | Status: DC
  Administered 2011-12-19 – 2011-12-20 (×3): 200 mg via ORAL
  Filled 2011-12-19 (×3): qty 2

## 2011-12-19 MED ORDER — MECLIZINE HCL 12.5 MG PO TABS
25.0000 mg | ORAL_TABLET | Freq: Three times a day (TID) | ORAL | Status: DC
  Administered 2011-12-19 (×2): 25 mg via ORAL
  Filled 2011-12-19 (×2): qty 2

## 2011-12-19 MED ORDER — PANTOPRAZOLE SODIUM 40 MG IV SOLR
40.0000 mg | Freq: Once | INTRAVENOUS | Status: DC
  Administered 2011-12-19: 40 mg via INTRAVENOUS
  Filled 2011-12-19: qty 40

## 2011-12-19 MED ORDER — GI COCKTAIL ~~LOC~~
30.0000 mL | Freq: Once | ORAL | Status: AC
  Administered 2011-12-19: 30 mL via ORAL
  Filled 2011-12-19: qty 30

## 2011-12-19 MED ORDER — ACETAMINOPHEN 325 MG PO TABS: 650.0000 mg | ORAL_TABLET | Freq: Four times a day (QID) | ORAL | Status: DC | PRN

## 2011-12-19 MED ORDER — ACETAMINOPHEN 650 MG RE SUPP: 650.0000 mg | Freq: Four times a day (QID) | RECTAL | Status: DC | PRN

## 2011-12-19 MED ORDER — PHENOBARBITAL 97.2 MG PO TABS
97.2000 mg | ORAL_TABLET | Freq: Every day | ORAL | Status: DC
  Administered 2011-12-19: 97.2 mg via ORAL
  Filled 2011-12-19: qty 1

## 2011-12-19 MED ORDER — SODIUM CHLORIDE 0.9 % IV SOLN
INTRAVENOUS | Status: AC
  Administered 2011-12-19: 05:00:00 via INTRAVENOUS

## 2011-12-19 MED ORDER — SODIUM CHLORIDE 0.9 % IJ SOLN
3.0000 mL | Freq: Two times a day (BID) | INTRAMUSCULAR | Status: DC
  Administered 2011-12-19 – 2011-12-20 (×2): 3 mL via INTRAVENOUS

## 2011-12-19 MED ORDER — MECLIZINE HCL 12.5 MG PO TABS
25.0000 mg | ORAL_TABLET | Freq: Once | ORAL | Status: AC
  Administered 2011-12-19: 25 mg via ORAL
  Filled 2011-12-19: qty 2

## 2011-12-19 MED ORDER — PANTOPRAZOLE SODIUM 40 MG IV SOLR: 40.0000 mg | INTRAVENOUS | Status: DC

## 2011-12-19 MED ORDER — HYDROCODONE-ACETAMINOPHEN 5-325 MG PO TABS: 1.0000 | ORAL_TABLET | ORAL | Status: DC | PRN

## 2011-12-19 MED ORDER — ONDANSETRON HCL 4 MG PO TABS: 4.0000 mg | ORAL_TABLET | Freq: Four times a day (QID) | ORAL | Status: DC | PRN

## 2011-12-19 MED ORDER — LEVOTHYROXINE SODIUM 100 MCG PO TABS
200.0000 ug | ORAL_TABLET | Freq: Every day | ORAL | Status: DC
  Administered 2011-12-19 – 2011-12-20 (×2): 200 ug via ORAL
  Filled 2011-12-19 (×2): qty 2

## 2011-12-19 MED ORDER — FERROUS SULFATE 325 (65 FE) MG PO TABS
325.0000 mg | ORAL_TABLET | Freq: Every day | ORAL | Status: DC
  Administered 2011-12-19: 325 mg via ORAL
  Filled 2011-12-19: qty 1

## 2011-12-19 NOTE — H&P (Signed)
[facility] History and Physical  Gabriella Hodges ZOX:[id number] DOB: 12-08-1960 DOA: 12/19/2011   PCP: Colette Ribas, MD  Specialists: None  Chief Complaint: Dizziness for the last 2 weeks  HPI: Gabriella Hodges is a 51 y.o. female with a past medical history of seizure disorder, hypothyroidism, who was in her usual state of health till about 2 weeks ago, when she started having dizziness. She describes this as a sensation of the room spinning around her. She felt unsteady on her gait. She had multiple episodes of nausea and vomiting. She went to her doctor's office for these complaints and was prescribed antibiotics (Zpak). She was told that she may have the ear infection on the left side. Patient denies any hearing impairment. No pain or ear discharge. She does hear a ringing sensation, sometimes in the left ear. Since her symptoms were not improving. She decided to come in to the hospital today. Also, her husband and son got concerned, because she had, what they call a shaking episode during which she was not fully conscious. It is possible that the patient may have had a seizure. She does not recall these events currently. She's feeling a little bit better. She's complaining of some burning sensation in the chest for the last day or so, ever since she has been vomiting. Denies any shortness of breath. She denies any focal weakness. Denies any vision impairments.  Home Medications: Prior to Admission medications   Medication Sig Start Date End Date Taking? Authorizing Provider  ALPRAZolam Prudy Feeler) 0.5 MG tablet Take 0.5 mg by mouth 3 (three) times daily as needed. anxiety     Historical Provider, MD  Calcium Carbonate-Vitamin D (CALCIUM 600+D) 600-200 MG-UNIT TABS Take 2 tablets by mouth 2 (two) times daily.      Historical Provider, MD  Docusate Calcium (STOOL SOFTENER PO) Take by mouth.      Historical Provider, MD  docusate sodium (COLACE) 100 MG capsule Take 200 mg by mouth 2  (two) times daily.      Historical Provider, MD  ferrous sulfate 325 (65 FE) MG tablet Take 325 mg by mouth daily.      Historical Provider, MD  HYDROcodone-acetaminophen (NORCO) 5-325 MG per tablet Take 1 tablet by mouth every 4 (four) hours as needed.      Historical Provider, MD  levothyroxine (SYNTHROID, LEVOTHROID) 200 MCG tablet Take 200 mcg by mouth daily.      Historical Provider, MD  Levothyroxine Sodium (SYNTHROID PO) Take by mouth.      Historical Provider, MD  Multiple Vitamin (MULTIVITAMIN) tablet Take 1 tablet by mouth 2 (two) times daily.     Historical Provider, MD  pantoprazole (PROTONIX) 20 MG tablet Take 2 tablets (40 mg total) by mouth daily. 09/10/10 09/10/11  Cheri Guppy, MD  PHENobarbital (LUMINAL) 97.2 MG tablet Take 97.2 mg by mouth at bedtime.      Historical Provider, MD  PHENOBARBITAL PO Take by mouth.      Historical Provider, MD  vitamin B-12 (CYANOCOBALAMIN) 100 MCG tablet Take 50 mcg by mouth daily.      Historical Provider, MD    Allergies:  Allergies  Allergen Reactions  . Nsaids Hives  . Penicillins Hives  . Shellfish-Derived Products Other (See Comments)    Throat swells  . Sulfa Antibiotics Hives    Past Medical History: Past Medical History  Diagnosis Date  . Epilepsy   . Thyroid disease     Past Surgical History  Procedure Date  .  Tumors   . Gastric bypass     Social History:  reports that she has been smoking.  She does not have any smokeless tobacco history on file. She reports that she does not drink alcohol or use illicit drugs.  Living Situation: She lives in [place] with her husband Activity Level: Usually independent with daily activities   Family History:  Family History  Problem Relation Age of Onset  . Heart disease    . Lung disease    . Cancer    . Diabetes      Review of Systems - History obtained from the patient General ROS: positive for  - fatigue Psychological ROS: negative Ophthalmic ROS: negative ENT  ROS: positive for - tinnitus and vertigo Allergy and Immunology ROS: negative Hematological and Lymphatic ROS: negative Endocrine ROS: negative Respiratory ROS: no cough, shortness of breath, or wheezing Cardiovascular ROS: no chest pain or dyspnea on exertion Gastrointestinal ROS: as in hpi Genito-Urinary ROS: no dysuria, trouble voiding, or hematuria Musculoskeletal ROS: negative Neurological ROS: no TIA or stroke symptoms Dermatological ROS: negative  Physical Examination  Filed Vitals:   12/19/11 0040 12/19/11 0200 12/19/11 0300  BP: 149/75 121/63 137/74  Pulse: 68 66 66  Temp: 97.6 F (36.4 C)    TempSrc: Oral    Resp: 20    Height: 5\' 7"  (1.702 m)    Weight: 120.203 kg (265 lb)    SpO2: 98% 97% 98%    General appearance: alert, cooperative, appears stated age and no distress Head: Normocephalic, without obvious abnormality, atraumatic Eyes: conjunctivae/corneas clear. PERRL, EOM's intact.  Ears: normal TM's and external ear canals both ears Neck: no adenopathy, no carotid bruit, no JVD, supple, symmetrical, trachea midline and thyroid not enlarged, symmetric, no tenderness/mass/nodules Resp: clear to auscultation bilaterally Cardio: regular rate and rhythm, S1, S2 normal, no murmur, click, rub or gallop GI: soft, mildly tender in epigastrium without rebound rigidity; bowel sounds normal; no masses,  no organomegaly Extremities: extremities normal, atraumatic, no cyanosis or edema Pulses: 2+ and symmetric Skin: Skin color, texture, turgor normal. No rashes or lesions Lymph nodes: Cervical, supraclavicular, and axillary nodes normal. Neurologic: She is alert and oriented x3. Cranial nerves are intact. Motor strength is equal bilaterally. Cerebellar signs were normal.  Laboratory Data: Results for orders placed during the hospital encounter of 12/19/11 (from the past 48 hour(s))  CBC WITH DIFFERENTIAL     Status: Normal   Collection Time   12/19/11  1:36 AM       Component Value Range Comment   WBC 5.8  4.0 - 10.5 K/uL    RBC 4.27  3.87 - 5.11 MIL/uL    Hemoglobin 13.1  12.0 - 15.0 g/dL    HCT 16.1  09.6 - 04.5 %    MCV 89.2  78.0 - 100.0 fL    MCH 30.7  26.0 - 34.0 pg    MCHC 34.4  30.0 - 36.0 g/dL    RDW 40.9  81.1 - 91.4 %    Platelets 184  150 - 400 K/uL    Neutrophils Relative 70  43 - 77 %    Neutro Abs 4.0  1.7 - 7.7 K/uL    Lymphocytes Relative 18  12 - 46 %    Lymphs Abs 1.0  0.7 - 4.0 K/uL    Monocytes Relative 6  3 - 12 %    Monocytes Absolute 0.4  0.1 - 1.0 K/uL    Eosinophils Relative 5  0 -  5 %    Eosinophils Absolute 0.3  0.0 - 0.7 K/uL    Basophils Relative 1  0 - 1 %    Basophils Absolute 0.1  0.0 - 0.1 K/uL   BASIC METABOLIC PANEL     Status: Abnormal   Collection Time   12/19/11  1:36 AM      Component Value Range Comment   Sodium 138  135 - 145 mEq/L    Potassium 3.7  3.5 - 5.1 mEq/L    Chloride 104  96 - 112 mEq/L    CO2 26  19 - 32 mEq/L    Glucose, Bld 126 (*) 70 - 99 mg/dL    BUN 16  6 - 23 mg/dL    Creatinine, Ser 1.61  0.50 - 1.10 mg/dL    Calcium 8.9  8.4 - 09.6 mg/dL    GFR calc non Af Amer >90  >90 mL/min    GFR calc Af Amer >90  >90 mL/min     Radiology Reports: Ct Head Wo Contrast  12/19/2011  *RADIOLOGY REPORT*  Clinical Data: Dizziness for 2 weeks.  Epilepsy.  CT HEAD WITHOUT CONTRAST  Technique:  Contiguous axial images were obtained from the base of the skull through the vertex without contrast.  Comparison: None.  Findings: No mass lesion, mass effect, midline shift, hydrocephalus, hemorrhage.  No territorial ischemia or acute infarction.  Intracranial atherosclerosis. Mastoid air cells appear clear.  No middle ear effusions identified.  IMPRESSION: Negative CT head.   Original Report Authenticated By: Andreas Newport, M.D.     Electrocardiogram: Pending  Problem List  Principal Problem:  *Vertigo Active Problems:  Seizure disorder  Hypothyroidism   Assessment: This is a 51 year old,  Caucasian female, with a past medical history of seizure disorder, and hypothyroidism, who presents to the hospital with dizziness for the last 2 weeks. She may have had a seizure episode at home as well. Most of her symptoms are suggestive of vertigo. There is no neurological abnormalities or focal deficits that are seen. On examination. However, the fact, that her symptoms have lasted 2 weeks little concerning. The burning sensation in the chest is most likely due to acid reflux from multiple episodes of vomiting.  Plan: #1 dizziness/vertigo: CT of the head was unremarkable. For acute events. We will get MRI of the brain, to rule out a central etiology for her symptoms. We will place the patient on meclizine and Valium for now. Antiemetics will be prescribed as needed. Physical and occupational therapy. Will be assisting. The patient.  #2 seizure disorder, with possible episode of seizure today: Seizure precautions will utilize. Phenobarbital level. Will be checked. Continue with phenobarbital for now. Follow up on MRI.  #3 history of hypothyroidism: Continue with Synthroid.  #4 burning chest pain: Most likely GI in etiology. Due to multiple episodes of vomiting. We'll give her PPI. This is been ongoing since the at least the last 24-36 hours. We'll check a troponin just to be sure. Check LFT and Lipase to further evaluate the epigastric tenderness.  Further management decisions will depend on results of further testing and patient's response to treatment.  Code Status: She is a full code Family Communication: Her husband and her son, but at the, bedside  Disposition Plan: Likely return home when improved   [facility], [place]  [facility] Pager [phone]  If 7PM-7AM, please contact night-coverage [email or web address] Password [email or web address]  12/19/2011, 3:16 AM

## 2011-12-19 NOTE — Plan of Care (Signed)
Problem: Consults Goal: General Medical Patient Education See Patient Education Module for specific education. Outcome: Progressing PT reports progressively worsening dizziness over a 2 week period. Pt did see Dr Assunta Found with in the first week of symptoms & received Rx??? Pt came to ED via EMS with virtigo & n/v. See ED notes. Pt arrival to ICU without any camplaints. Pt is resting quietly.  Problem: Phase I Progression Outcomes Goal: Hemodynamically stable Outcome: Progressing Pt HR67  resp14 on R/A sat=98 BP=121/69

## 2011-12-19 NOTE — ED Notes (Signed)
sleeping

## 2011-12-19 NOTE — Evaluation (Signed)
Physical Therapy Evaluation Patient Details Name: Gabriella Hodges MRN: [id number] DOB: 05/31/1960 Today's Date: 12/19/2011 Time: 7829-5621 PT Time Calculation (min): 35 min  PT Assessment / Plan / Recommendation Clinical Impression  Pt was seen for eval.  She is alert and oriented, states that she continues to have very mild dizziness that is not related to her position in space or the position of her head.Marland Kitchen  She was noted to have mild weakness in the RLE as compared to the LLE with coordination WNL.  She does have some balance deficit, probably related to her mild dizziness.  If this persists, she will need a cane or walker to stabilize gait.    PT Assessment  Patient needs continued PT services    Follow Up Recommendations  Outpatient PT (this may be needed if balance is still decreased)    Does the patient have the potential to tolerate intense rehabilitation      Barriers to Discharge None      Equipment Recommendations  None recommended by PT    Recommendations for Other Services OT consult   Frequency Min 3X/week    Precautions / Restrictions Precautions Precautions: Fall Restrictions Weight Bearing Restrictions: No   Pertinent Vitals/Pain       Mobility  Bed Mobility Bed Mobility: Sit to Supine;Supine to Sit Supine to Sit: 7: Independent Sit to Supine: 7: Independent Transfers Transfers: Sit to Stand;Stand to Sit Sit to Stand: 7: Independent Stand to Sit: 7: Independent Ambulation/Gait Ambulation/Gait Assistance: 5: Supervision Ambulation Distance (Feet): 125 Feet Assistive device: None Gait Pattern: Within Functional Limits Gait velocity: gait is slow and measured General Gait Details: there was one LOB while pt was turning head to the R...she lost balance to the R but was able to self correct Stairs: No Wheelchair Mobility Wheelchair Mobility: No    Shoulder Instructions     Exercises     PT Diagnosis: Difficulty walking  PT Problem List:  Decreased strength;Decreased balance;Decreased mobility PT Treatment Interventions: DME instruction;Gait training;Stair training;Balance training   PT Goals Acute Rehab PT Goals PT Goal Formulation: With patient/family Time For Goal Achievement: 12/26/11 Potential to Achieve Goals: Good Pt will Ambulate: >150 feet;with modified independence;with least restrictive assistive device PT Goal: Ambulate - Progress: Goal set today Pt will Go Up / Down Stairs: 3-5 stairs;with supervision;with least restrictive assistive device  Visit Information  Last PT Received On: 12/19/11    Subjective Data  Subjective: I'm just a little bit dizzy now Patient Stated Goal: none stated   Prior Functioning  Home Living Lives With: Spouse Available Help at Discharge: Family;Available 24 hours/day Type of Home: House Home Access: Stairs to enter [employer] of Steps: 3 Entrance Stairs-Rails: None Home Layout: Two level;Able to live on main level with bedroom/bathroom Home Adaptive Equipment: None Prior Function Level of Independence: Independent Able to Take Stairs?: Yes Driving: Yes Vocation: Unemployed Communication Communication: No difficulties    Cognition  Overall Cognitive Status: Appears within functional limits for tasks assessed/performed Arousal/Alertness: Awake/alert Orientation Level: Appears intact for tasks assessed Behavior During Session: [facility] for tasks performed    Extremity/Trunk Assessment Right Lower Extremity Assessment RLE ROM/Strength/Tone: WFL for tasks assessed (4-/5) RLE Sensation: WFL - Light Touch RLE Coordination: WFL - gross motor Left Lower Extremity Assessment LLE ROM/Strength/Tone: Within functional levels LLE Sensation: WFL - Light Touch LLE Coordination: WFL - gross motor Trunk Assessment Trunk Assessment: Normal   Balance Balance Balance Assessed: Yes High Level Balance High Level Balance Activites: Backward walking;Direction  changes;Turns;Sudden stops;Head turns High Level Balance Comments: gait is very slow and measured, tenuous.Marland KitchenMarland KitchenLOB with head turn to R  End of Session PT - End of Session Equipment Utilized During Treatment: Gait belt Activity Tolerance: Patient tolerated treatment well Patient left: in bed;with call bell/phone within reach;with family/visitor present Nurse Communication: Mobility status  GP     Konrad Penta 12/19/2011, 12:49 PM

## 2011-12-19 NOTE — ED Provider Notes (Signed)
History     CSN: [id number]  Arrival date & time 12/19/11  0027   First MD Initiated Contact with Patient 12/19/11 0031      Chief Complaint  Patient presents with  . Dizziness    (Consider location/radiation/quality/duration/timing/severity/associated sxs/prior treatment) The history is provided by the patient.   51 year old female had an episode which started at about 11:30 PM tonight of getting severely dizzy and vomited several times. Her husband states that she was "as white as a sheet". She says dizziness is worse with any kind of movement and she is continuing to have nausea. Dizziness is described as the room spinning around. She has been having dizzy spells for the last 2 weeks and dizziness is generally worse with movement and is associated with nausea. Denies headache or tinnitus. She took some kind of motion sickness pill at home and family member is with her thinks it was Valium. She also has received medication from an ear doctor but she's not sure what that medication is, or what it was specifically treating.  Past Medical History  Diagnosis Date  . Epilepsy   . Thyroid disease     Past Surgical History  Procedure Date  . Tumors   . Gastric bypass     Family History  Problem Relation Age of Onset  . Heart disease    . Lung disease    . Cancer    . Diabetes      History  Substance Use Topics  . Smoking status: Never Smoker   . Smokeless tobacco: Not on file  . Alcohol Use: No    OB History    Grav Para Term Preterm Abortions TAB SAB Ect Mult Living                  Review of Systems  All other systems reviewed and are negative.    Allergies  Nsaids; Penicillins; Shellfish-derived products; and Sulfa antibiotics  Home Medications   Current Outpatient Rx  Name  Route  Sig  Dispense  Refill  . ALPRAZOLAM 0.5 MG PO TABS   Oral   Take 0.5 mg by mouth 3 (three) times daily as needed. anxiety          . CALCIUM CARBONATE-VITAMIN D 600-200  MG-UNIT PO TABS   Oral   Take 2 tablets by mouth 2 (two) times daily.           . STOOL SOFTENER PO   Oral   Take by mouth.           . DOCUSATE SODIUM 100 MG PO CAPS   Oral   Take 200 mg by mouth 2 (two) times daily.           Marland Kitchen FERROUS SULFATE 325 (65 FE) MG PO TABS   Oral   Take 325 mg by mouth daily.           Marland Kitchen HYDROCODONE-ACETAMINOPHEN 5-325 MG PO TABS   Oral   Take 1 tablet by mouth every 4 (four) hours as needed.           Marland Kitchen LEVOTHYROXINE SODIUM 200 MCG PO TABS   Oral   Take 200 mcg by mouth daily.           Marland Kitchen SYNTHROID PO   Oral   Take by mouth.           . ONE-DAILY MULTI VITAMINS PO TABS   Oral   Take 1 tablet by mouth 2 (two) times  daily.          Marland Kitchen PANTOPRAZOLE SODIUM 20 MG PO TBEC   Oral   Take 2 tablets (40 mg total) by mouth daily.   30 tablet   0   . PHENOBARBITAL 97.2 MG PO TABS   Oral   Take 97.2 mg by mouth at bedtime.           Marland Kitchen PHENOBARBITAL PO   Oral   Take by mouth.           Marland Kitchen VITAMIN B-12 100 MCG PO TABS   Oral   Take 50 mcg by mouth daily.             BP 149/75  Pulse 68  Temp 97.6 F (36.4 C) (Oral)  Resp 20  Ht 5\' 7"  (1.702 m)  Wt 265 lb (120.203 kg)  BMI 41.50 kg/m2  SpO2 98%  Physical Exam  Nursing note and vitals reviewed. 51 year old female, resting comfortably and in no acute distress. She is somnolent but arousable. Vital signs are significant for hypertension with blood pressure 149/75. Oxygen saturation is 98%, which is normal. Head is normocephalic and atraumatic. PERRLA, EOMI. While sleeping, her eyes tend to drift to the right. However, when she is awake, she is able to track over a full range of extraocular movements. No nystagmus is seen. Fundi show no hemorrhage, exudate, or papilledema. Oropharynx is clear. Neck is nontender and supple without adenopathy or JVD. There no carotid bruits. Back is nontender and there is no CVA tenderness. Lungs are clear without rales, wheezes, or  rhonchi. Chest is nontender. Heart has regular rate and rhythm without murmur. Abdomen is soft, flat, nontender without masses or hepatosplenomegaly and peristalsis is normoactive. Extremities have no cyanosis or edema, full range of motion is present. Skin is warm and dry without rash. Neurologic: Mental status is as noted above, cranial nerves are intact, there are no motor or sensory deficits. Dizziness is reproduced with any head movement.   ED Course  Procedures (including critical care time)  Results for orders placed during the hospital encounter of 12/19/11  CBC WITH DIFFERENTIAL      Component Value Range   WBC 5.8  4.0 - 10.5 K/uL   RBC 4.27  3.87 - 5.11 MIL/uL   Hemoglobin 13.1  12.0 - 15.0 g/dL   HCT 16.1  09.6 - 04.5 %   MCV 89.2  78.0 - 100.0 fL   MCH 30.7  26.0 - 34.0 pg   MCHC 34.4  30.0 - 36.0 g/dL   RDW 40.9  81.1 - 91.4 %   Platelets 184  150 - 400 K/uL   Neutrophils Relative 70  43 - 77 %   Neutro Abs 4.0  1.7 - 7.7 K/uL   Lymphocytes Relative 18  12 - 46 %   Lymphs Abs 1.0  0.7 - 4.0 K/uL   Monocytes Relative 6  3 - 12 %   Monocytes Absolute 0.4  0.1 - 1.0 K/uL   Eosinophils Relative 5  0 - 5 %   Eosinophils Absolute 0.3  0.0 - 0.7 K/uL   Basophils Relative 1  0 - 1 %   Basophils Absolute 0.1  0.0 - 0.1 K/uL  BASIC METABOLIC PANEL      Component Value Range   Sodium 138  135 - 145 mEq/L   Potassium 3.7  3.5 - 5.1 mEq/L   Chloride 104  96 - 112 mEq/L   CO2 26  19 - 32 mEq/L   Glucose, Bld 126 (*) 70 - 99 mg/dL   BUN 16  6 - 23 mg/dL   Creatinine, Ser 0.45  0.50 - 1.10 mg/dL   Calcium 8.9  8.4 - 40.9 mg/dL   GFR calc non Af Amer >90  >90 mL/min   GFR calc Af Amer >90  >90 mL/min   Ct Head Wo Contrast  12/19/2011  *RADIOLOGY REPORT*  Clinical Data: Dizziness for 2 weeks.  Epilepsy.  CT HEAD WITHOUT CONTRAST  Technique:  Contiguous axial images were obtained from the base of the skull through the vertex without contrast.  Comparison: None.  Findings:  No mass lesion, mass effect, midline shift, hydrocephalus, hemorrhage.  No territorial ischemia or acute infarction.  Intracranial atherosclerosis. Mastoid air cells appear clear.  No middle ear effusions identified.  IMPRESSION: Negative CT head.   Original Report Authenticated By: Andreas Newport, M.D.      Date: 12/19/2011  Rate: 56  Rhythm: sinus bradycardia  QRS Axis: normal  Intervals: normal  ST/T Wave abnormalities: normal  Conduction Disutrbances:none  Narrative Interpretation: Low voltage, sinus bradycardia. When compared with ECG of 09/10/2010, QRS voltage has decreased.  Old EKG Reviewed: changes noted   1. Vertigo       MDM  Dizziness which appears to be labyrinthine in origin. She will be given IV fluids, IV ondansetron, and oral meclizine. CT of the head will be obtained and she will be reassessed. If symptoms persist and head CT is negative, she will need MRI scan to rule out brainstem stroke.  CT scan is unremarkable as is laboratory workup. She had no relief with above noted medications. Case is discussed with Dr. Rito Ehrlich who agrees to admit her to the hospital under observation status.    Dione Booze, MD 12/19/11 [phone]

## 2011-12-19 NOTE — ED Notes (Signed)
Patient transported to CT 

## 2011-12-19 NOTE — ED Notes (Signed)
Pt c/o persistent dizziness for 2 weeks. Denies vomiting, diarrhea, chest pain, sob. Does c/o nausea with movement.

## 2011-12-19 NOTE — Progress Notes (Signed)
OT Cancellation Note  Patient Details Name: Gabriella Hodges MRN: [id number] DOB: [birth year]   Cancelled Treatment:    Reason Eval/Treat Not Completed: Patient at procedure or test/ unavailable. Patient unavailable for OT eval. Patient EKG at this time. Will re-attempt tomorrow.   Limmie Patricia, OTR/L  12/19/2011, 3:22 PM

## 2011-12-19 NOTE — ED Notes (Addendum)
Resting quietly, eyes open, more responsive verbally. States she feels better, dizziness is less, and no further nausea

## 2011-12-19 NOTE — Progress Notes (Signed)
UR Chart Review Completed  

## 2011-12-19 NOTE — ED Notes (Signed)
Initial rescue call was for "seizure" however Medic states that there was no evidence of seizures, pt not post ictal. No seizure enroute to hospital.

## 2011-12-20 LAB — URINALYSIS, ROUTINE W REFLEX MICROSCOPIC
Bilirubin Urine: NEGATIVE
Glucose, UA: NEGATIVE mg/dL
Leukocytes, UA: NEGATIVE
Nitrite: NEGATIVE
Specific Gravity, Urine: >1.03 — ABNORMAL HIGH (ref 1.005–1.030)
pH: 5.5 (ref 5.0–8.0)

## 2011-12-20 MED ORDER — OMEPRAZOLE 40 MG PO CPDR: 40.0000 mg | DELAYED_RELEASE_CAPSULE | Freq: Every day | ORAL | Status: DC

## 2011-12-20 MED ORDER — MECLIZINE HCL 25 MG PO TABS: 25.0000 mg | ORAL_TABLET | Freq: Three times a day (TID) | ORAL | Status: DC | PRN

## 2011-12-20 NOTE — Progress Notes (Signed)
Chart reviewed. Patient examined.  MRI negative.  Patient too sedated to be discharged.

## 2011-12-20 NOTE — Progress Notes (Signed)
Patient discharged home with husband.  Instructed to increase activity slowly and follow up with PCP if symptoms worsen.  Instructed on new medications.  IV removed - WNL.  Patient has no questions at this time.  Stable for discharge.

## 2011-12-20 NOTE — Discharge Summary (Signed)
Physician Discharge Summary  Patient ID: Gabriella Hodges MRN: [id number] DOB/AGE: Nov 13, 1960 51 y.o.  Admit date: 12/19/2011 Discharge date: 12/20/2011  Discharge Diagnoses:  Principal Problem:  *Vertigo Active Problems:  Seizure disorder  Hypothyroidism  Burning in the chest     Medication List     As of 12/20/2011 11:34 AM    TAKE these medications         ALPRAZolam 0.5 MG tablet   Commonly known as: XANAX   Take 0.5 mg by mouth 3 (three) times daily as needed. anxiety      Calcium 600+D 600-200 MG-UNIT Tabs   Generic drug: Calcium Carbonate-Vitamin D   Take 2 tablets by mouth 2 (two) times daily.      docusate sodium 100 MG capsule   Commonly known as: COLACE   Take 100 mg by mouth daily.      ferrous sulfate 325 (65 FE) MG tablet   Take 325 mg by mouth daily.      levothyroxine 175 MCG tablet   Commonly known as: SYNTHROID, LEVOTHROID   Take 175 mcg by mouth daily.      meclizine 25 MG tablet   Commonly known as: ANTIVERT   Take 1 tablet (25 mg total) by mouth 3 (three) times daily as needed for dizziness or nausea.      multivitamin tablet   Take 1 tablet by mouth 2 (two) times daily.      omeprazole 40 MG capsule   Commonly known as: PRILOSEC   Take 1 capsule (40 mg total) by mouth daily.      PHENobarbital 97.2 MG tablet   Commonly known as: LUMINAL   Take 97.2 mg by mouth at bedtime.      vitamin B-12 1000 MCG tablet   Commonly known as: CYANOCOBALAMIN   Take 1,000 mcg by mouth daily.            Discharge Orders    Future Orders Please Complete By Expires   Diet general      Increase activity slowly         Follow-up Information    Follow up with Colette Ribas, MD. (If symptoms worsen)    Contact information:   [address] [place] [address] [phone]          Disposition: 01-Home or Self Care  Discharged Condition: stable  Consults:  none  Labs:   Results for orders placed  during the hospital encounter of 12/19/11 (from the past 48 hour(s))  CBC WITH DIFFERENTIAL     Status: Normal   Collection Time   12/19/11  1:36 AM      Component Value Range Comment   WBC 5.8  4.0 - 10.5 K/uL    RBC 4.27  3.87 - 5.11 MIL/uL    Hemoglobin 13.1  12.0 - 15.0 g/dL    HCT 86.5  78.4 - 69.6 %    MCV 89.2  78.0 - 100.0 fL    MCH 30.7  26.0 - 34.0 pg    MCHC 34.4  30.0 - 36.0 g/dL    RDW 29.5  28.4 - 13.2 %    Platelets 184  150 - 400 K/uL    Neutrophils Relative 70  43 - 77 %    Neutro Abs 4.0  1.7 - 7.7 K/uL    Lymphocytes Relative 18  12 - 46 %    Lymphs Abs 1.0  0.7 - 4.0 K/uL  Monocytes Relative 6  3 - 12 %    Monocytes Absolute 0.4  0.1 - 1.0 K/uL    Eosinophils Relative 5  0 - 5 %    Eosinophils Absolute 0.3  0.0 - 0.7 K/uL    Basophils Relative 1  0 - 1 %    Basophils Absolute 0.1  0.0 - 0.1 K/uL   BASIC METABOLIC PANEL     Status: Abnormal   Collection Time   12/19/11  1:36 AM      Component Value Range Comment   Sodium 138  135 - 145 mEq/L    Potassium 3.7  3.5 - 5.1 mEq/L    Chloride 104  96 - 112 mEq/L    CO2 26  19 - 32 mEq/L    Glucose, Bld 126 (*) 70 - 99 mg/dL    BUN 16  6 - 23 mg/dL    Creatinine, Ser 1.19  0.50 - 1.10 mg/dL    Calcium 8.9  8.4 - 14.7 mg/dL    GFR calc non Af Amer >90  >90 mL/min    GFR calc Af Amer >90  >90 mL/min   TROPONIN I     Status: Normal   Collection Time   12/19/11  2:57 AM      Component Value Range Comment   Troponin I <0.30  <0.30 ng/mL   LIPASE, BLOOD     Status: Normal   Collection Time   12/19/11  3:07 AM      Component Value Range Comment   Lipase 25  11 - 59 U/L   HEPATIC FUNCTION PANEL     Status: Abnormal   Collection Time   12/19/11  3:07 AM      Component Value Range Comment   Total Protein 6.4  6.0 - 8.3 g/dL    Albumin 3.6  3.5 - 5.2 g/dL    AST 16  0 - 37 U/L    ALT 13  0 - 35 U/L    Alkaline Phosphatase 88  39 - 117 U/L    Total Bilirubin 0.2 (*) 0.3 - 1.2 mg/dL    Bilirubin, Direct 0.1   0.0 - 0.3 mg/dL    Indirect Bilirubin 0.1 (*) 0.3 - 0.9 mg/dL   MRSA PCR SCREENING     Status: Normal   Collection Time   12/19/11  5:43 AM      Component Value Range Comment   MRSA by PCR NEGATIVE  NEGATIVE   PHENOBARBITAL LEVEL     Status: Abnormal   Collection Time   12/19/11  5:45 AM      Component Value Range Comment   Phenobarbital 3.1 (*) 15.0 - 40.0 ug/mL   URINALYSIS, ROUTINE W REFLEX MICROSCOPIC     Status: Abnormal   Collection Time   12/20/11  3:59 AM      Component Value Range Comment   Color, Urine YELLOW  YELLOW    APPearance CLEAR  CLEAR    Specific Gravity, Urine >1.030 (*) 1.005 - 1.030    pH 5.5  5.0 - 8.0    Glucose, UA NEGATIVE  NEGATIVE mg/dL    Hgb urine dipstick NEGATIVE  NEGATIVE    Bilirubin Urine NEGATIVE  NEGATIVE    Ketones, ur NEGATIVE  NEGATIVE mg/dL    Protein, ur NEGATIVE  NEGATIVE mg/dL    Urobilinogen, UA 0.2  0.0 - 1.0 mg/dL    Nitrite NEGATIVE  NEGATIVE    Leukocytes, UA NEGATIVE  NEGATIVE MICROSCOPIC  NOT DONE ON URINES WITH NEGATIVE PROTEIN, BLOOD, LEUKOCYTES, NITRITE, OR GLUCOSE <1000 mg/dL.    Diagnostics:  Ct Head Wo Contrast  12/19/2011  *RADIOLOGY REPORT*  Clinical Data: Dizziness for 2 weeks.  Epilepsy.  CT HEAD WITHOUT CONTRAST  Technique:  Contiguous axial images were obtained from the base of the skull through the vertex without contrast.  Comparison: None.  Findings: No mass lesion, mass effect, midline shift, hydrocephalus, hemorrhage.  No territorial ischemia or acute infarction.  Intracranial atherosclerosis. Mastoid air cells appear clear.  No middle ear effusions identified.  IMPRESSION: Negative CT head.   Original Report Authenticated By: Andreas Newport, M.D.    Mr Laqueta Jean Wo Contrast  12/19/2011  *RADIOLOGY REPORT*  Clinical Data: History of seizure disorder.  Developed dizziness 2 weeks ago.  Unsteady gait.  Episodes of nausea and vomiting.  MRI HEAD WITHOUT AND WITH CONTRAST  Technique:  Multiplanar, multiecho pulse  sequences of the brain and surrounding structures were obtained according to standard protocol without and with intravenous contrast  Contrast: 18mL MULTIHANCE GADOBENATE DIMEGLUMINE 529 MG/ML IV SOLN  Comparison: 12/19/2011 CT.  No comparison MR.  Findings: No acute infarct.  No intracranial hemorrhage.  No evidence of mesial temporal sclerosis.  No intracranial mass lesion or abnormal enhancement noted on the postcontrast motion degraded images.  Major intracranial vascular structures are patent. Right vertebral artery diminutive in size anterior takeoff of the right PICA.  Paranasal sinus mucosal thickening with findings most notable left maxillary sinus.  Cerebellar tonsils slightly low-lying but within range of normal limits.  Pituitary region, pineal region and orbital structures unremarkable.  IMPRESSION: Slightly motion degraded examination without evidence of acute infarct or intracranial mass.  Paranasal sinus mucosal thickening most notable left maxillary sinus.   Original Report Authenticated By: Lacy Duverney, M.D.   EKG: Unusual P axis, possible ectopic atrial bradycardia Low voltage QRS Lateral infarct , age undetermined  Full Code   Hospital Course: See H&P for complete admission details. The patient is a 51 year old white female who presented with 2 weeks of worsening dizziness. Her gait felt unsteady. She had nausea and vomiting. She was total she may have an ear infection by her primary care provider and given a prescription for Z-Pak. She had some tinnitus in her left ear. She had no ear pain. No fevers. Burning central chest pain. The family also reported shaking spell. She has a history of seizures. In the emergency room, patient was alert and oriented and nonfocal neurologic examination.  The patient was admitted to telemetry. MRI showed no evidence of stroke. She was started on proton pump inhibitor, meclizine and IV fluids. Her symptoms improved. She was able to ambulate safely and  is stable for discharge.  Discharge Exam:  Blood pressure 130/79, pulse 60, temperature 98.2 F (36.8 C), temperature source Oral, resp. rate 16, height 5\' 4"  (1.626 m), weight 88.3 kg (194 lb 10.7 oz), SpO2 100.00%.  Gen:  Comfortable.  Sitting upright in bed. HEENT: No nystagmus. Lungs clear to auscultation bilaterally without wheezes rhonchi or rales Cardiovascular regular rate rhythm without murmurs gallops rubs  Signed: Evvie Behrmann L 12/20/2011, 11:34 AM

## 2011-12-20 NOTE — Progress Notes (Signed)
Patient had a 7 beat run of v-tach prior to telemetry discontinuation. Made judgement to leave telemetry in place. Please reevaluate need for telemetry.

## 2015-07-14 ENCOUNTER — Other Ambulatory Visit: Payer: Self-pay | Admitting: Registered Nurse

## 2015-07-14 DIAGNOSIS — Z1231 Encounter for screening mammogram for malignant neoplasm of breast: Secondary | ICD-10-CM

## 2016-04-19 ENCOUNTER — Ambulatory Visit: Payer: Medicaid Other | Admitting: Neurology

## 2016-05-07 ENCOUNTER — Telehealth: Payer: Self-pay | Admitting: *Deleted

## 2016-05-07 ENCOUNTER — Ambulatory Visit: Payer: Medicaid Other | Admitting: Neurology

## 2016-05-07 NOTE — Telephone Encounter (Signed)
No showed new patient appointment. 

## 2016-05-08 ENCOUNTER — Telehealth: Payer: Self-pay | Admitting: Neurology

## 2016-05-08 ENCOUNTER — Encounter: Payer: Self-pay | Admitting: Neurology

## 2018-06-15 ENCOUNTER — Emergency Department (HOSPITAL_COMMUNITY)
Admission: EM | Admit: 2018-06-15 | Discharge: 2018-06-15 | Disposition: A | Discharge status: Home / Self Care | Payer: Self-pay | Attending: Emergency Medicine | Admitting: Emergency Medicine

## 2018-06-15 ENCOUNTER — Other Ambulatory Visit: Payer: Self-pay

## 2018-06-15 ENCOUNTER — Emergency Department (HOSPITAL_COMMUNITY): Payer: Self-pay

## 2018-06-15 ENCOUNTER — Encounter (HOSPITAL_COMMUNITY): Payer: Self-pay | Admitting: *Deleted

## 2018-06-15 DIAGNOSIS — S86912A Strain of unspecified muscle(s) and tendon(s) at lower leg level, left leg, initial encounter: Secondary | ICD-10-CM | POA: Insufficient documentation

## 2018-06-15 DIAGNOSIS — Y999 Unspecified external cause status: Secondary | ICD-10-CM | POA: Insufficient documentation

## 2018-06-15 DIAGNOSIS — Y939 Activity, unspecified: Secondary | ICD-10-CM | POA: Insufficient documentation

## 2018-06-15 DIAGNOSIS — X509XXA Other and unspecified overexertion or strenuous movements or postures, initial encounter: Secondary | ICD-10-CM | POA: Insufficient documentation

## 2018-06-15 DIAGNOSIS — E039 Hypothyroidism, unspecified: Secondary | ICD-10-CM | POA: Insufficient documentation

## 2018-06-15 DIAGNOSIS — Y929 Unspecified place or not applicable: Secondary | ICD-10-CM | POA: Insufficient documentation

## 2018-06-15 DIAGNOSIS — F17210 Nicotine dependence, cigarettes, uncomplicated: Secondary | ICD-10-CM | POA: Insufficient documentation

## 2018-06-15 DIAGNOSIS — Z79899 Other long term (current) drug therapy: Secondary | ICD-10-CM | POA: Insufficient documentation

## 2018-06-15 NOTE — ED Provider Notes (Signed)
[facility] EMERGENCY DEPARTMENT Provider Note   CSN: [id number] Arrival date & time: 06/15/18  2114    History   Chief Complaint Chief Complaint  Patient presents with  . Knee Pain    HPI Gabriella Hodges is a 58 y.o. female.     The history is provided by the patient. No language interpreter was used.  Knee Pain  Location:  Knee Time since incident:  1 day Injury: no   Knee location:  L knee Pain details:    Quality:  Aching   Radiates to:  Does not radiate   Severity:  Moderate   Onset quality:  Gradual   Duration:  1 day   Timing:  Constant Chronicity:  New Dislocation: no   Foreign body present:  No foreign bodies Relieved by:  Nothing Worsened by:  Nothing Ineffective treatments:  None tried  Pt reports she stepped off her porch and twisted her knee.  Pt complains of pain behind her knee Past Medical History:  Diagnosis Date  . Anxiety   . Epilepsy ([place])   . Hypothyroidism   . Thyroid disease     Patient Active Problem List   Diagnosis Date Noted  . Vertigo 12/19/2011  . Seizure disorder ([place]) 12/19/2011  . Hypothyroidism 12/19/2011  . Burning in the chest 12/19/2011    Past Surgical History:  Procedure Laterality Date  . GASTRIC BYPASS    . tumors       OB History   No obstetric history on file.      Home Medications    Prior to Admission medications   Medication Sig Start Date End Date Taking? Authorizing Provider  ALPRAZolam Duanne Moron) 0.5 MG tablet Take 0.5 mg by mouth 3 (three) times daily as needed. anxiety     [provider]  Calcium Carbonate-Vitamin D (CALCIUM 600+D) 600-200 MG-UNIT TABS Take 2 tablets by mouth 2 (two) times daily.      [provider]  docusate sodium (COLACE) 100 MG capsule Take 100 mg by mouth daily.    [provider]  ferrous sulfate 325 (65 FE) MG tablet Take 325 mg by mouth daily.      [provider]  levothyroxine (SYNTHROID, LEVOTHROID) 175 MCG tablet Take 175 mcg by  mouth daily.    [provider]  meclizine (ANTIVERT) 25 MG tablet Take 1 tablet (25 mg total) by mouth 3 (three) times daily as needed for dizziness or nausea. 12/20/11   Delfina Redwood, MD  Multiple Vitamin (MULTIVITAMIN) tablet Take 1 tablet by mouth 2 (two) times daily.     [provider]  omeprazole (PRILOSEC) 40 MG capsule Take 1 capsule (40 mg total) by mouth daily. 12/20/11   Delfina Redwood, MD  PHENobarbital (LUMINAL) 97.2 MG tablet Take 97.2 mg by mouth at bedtime.      [provider]  vitamin B-12 (CYANOCOBALAMIN) 1000 MCG tablet Take 1,000 mcg by mouth daily.    [provider]    Family History Family History  Problem Relation Age of Onset  . Heart disease Other   . Lung disease Other   . Cancer Other   . Diabetes Other     Social History Social History   Tobacco Use  . Smoking status: Current Every Day Smoker    Packs/day: 1.00  . Smokeless tobacco: Never Used  Substance Use Topics  . Alcohol use: No  . Drug use: No     Allergies   Shellfish-derived products;  Nsaids; Penicillins; and Sulfa antibiotics   Review of Systems Review of Systems  All other systems reviewed and are negative.    Physical Exam Updated Vital Signs BP 135/63   Pulse 60   Temp 98.2 F (36.8 C) (Oral)   Resp 16   Ht 5\' 4"  (1.626 m)   Wt 90.7 kg   SpO2 98%   BMI 34.33 kg/m   Physical Exam Vitals signs and nursing note reviewed.  HENT:     Head: Normocephalic.  Cardiovascular:     Rate and Rhythm: Normal rate.  Pulmonary:     Effort: Pulmonary effort is normal.  Musculoskeletal:        General: Swelling and tenderness present.     Comments: Tender left posterior knee, pain with range of motion,  nv and ns intact  Skin:    General: Skin is warm.  Neurological:     General: No focal deficit present.     Mental Status: She is alert.  Psychiatric:        Mood and Affect: Mood normal.      ED Treatments / Results   Labs (all labs ordered are listed, but only abnormal results are displayed) Labs Reviewed - No data to display  EKG None  Radiology No results found.  Procedures Procedures (including critical care time)  Medications Ordered in ED Medications - No data to display   Initial Impression / Assessment and Plan / ED Course  I have reviewed the triage vital signs and the nursing notes.  Pertinent labs & imaging results that were available during my care of the patient were reviewed by me and considered in my medical decision making (see chart for details).        MDM  Xray shows effusion,  Pt placed in a knee immbolizer.  Pt advised to follow up with Dr. Romeo AppleHarrison for recheck in 2-3 days  Final Clinical Impressions(s) / ED Diagnoses   Final diagnoses:  Strain of left knee, initial encounter    ED Discharge Orders    None    An After Visit Summary was printed and given to the patient.    Osie CheeksSofia, Ryanna Teschner K, PA-C 06/15/18 2236    Bethann BerkshireZammit, Joseph, MD 06/16/18 [phone]

## 2018-06-15 NOTE — ED Triage Notes (Signed)
Pt states that she went to step off her porch and twisted her left knee, c/o pain to posterior area of left knee,

## 2018-06-20 ENCOUNTER — Ambulatory Visit: Payer: Self-pay | Admitting: Orthopedic Surgery

## 2018-06-20 ENCOUNTER — Other Ambulatory Visit: Payer: Self-pay

## 2018-06-20 ENCOUNTER — Encounter: Payer: Self-pay | Admitting: Orthopedic Surgery

## 2018-06-20 VITALS — BP 148/78 | HR 75 | Temp 97.9°F | Ht 63.5 in | Wt 200.0 lb

## 2018-06-20 DIAGNOSIS — M25562 Pain in left knee: Secondary | ICD-10-CM

## 2018-06-20 NOTE — Patient Instructions (Signed)
Knee exercises 2 x a day   Economy hinge brace   Re check in 2 weeks   Ibuprofen OTC

## 2018-06-20 NOTE — Progress Notes (Signed)
NEW PROBLEM OFFICE VISIT  Chief Complaint  Patient presents with  . Knee Injury    06/15/18 left knee injury fell stepping off porch     58 year old female twisted her knee stepping off the porch on June 7 presents with a 5-day history of nonradiating dull aching posterior knee pain lateral knee pain currently taking ibuprofen 5 tablets at a time at times wearing a knee brace with continued mild to moderate discomfort no swelling but limping   Review of Systems  Musculoskeletal: Positive for joint pain.  All other systems reviewed and are negative.    Past Medical History:  Diagnosis Date  . Anxiety   . Epilepsy ([place])   . Hypothyroidism   . Thyroid disease     Past Surgical History:  Procedure Laterality Date  . GASTRIC BYPASS    . tumors      Family History  Problem Relation Age of Onset  . Heart disease Other   . Lung disease Other   . Cancer Other   . Diabetes Other    Social History   Tobacco Use  . Smoking status: Current Every Day Smoker    Packs/day: 1.00  . Smokeless tobacco: Never Used  Substance Use Topics  . Alcohol use: No  . Drug use: No    Allergies  Allergen Reactions  . Shellfish-Derived Products Anaphylaxis  . Nsaids Hives  . Penicillins Hives  . Sulfa Antibiotics Hives    Current Meds  Medication Sig  . levothyroxine (SYNTHROID, LEVOTHROID) 175 MCG tablet Take 175 mcg by mouth daily.  Marland Kitchen omeprazole (PRILOSEC) 40 MG capsule Take 1 capsule (40 mg total) by mouth daily.  Marland Kitchen PHENobarbital (LUMINAL) 97.2 MG tablet Take 97.2 mg by mouth at bedtime.      BP (!) 148/78   Pulse 75   Temp 97.9 F (36.6 C)   Ht 5' 3.5" (1.613 m)   Wt 200 lb (90.7 kg)   BMI 34.87 kg/m   Physical Exam Vitals signs and nursing note reviewed.  Constitutional:      Appearance: Normal appearance.  Musculoskeletal:     Right knee: She exhibits no effusion.     Left knee: She exhibits no effusion.  Neurological:     Mental Status: She is alert and oriented  to person, place, and time.     Gait: Gait abnormal.     Comments: Mild limp  Psychiatric:        Mood and Affect: Mood normal.     Right Knee Exam   Muscle Strength  The patient has normal right knee strength.  Tenderness  The patient is experiencing no tenderness.   Range of Motion  Extension: normal  Flexion: normal   Tests  McMurray:  Medial - negative Lateral - negative Varus: negative Valgus: negative Drawer:  Anterior - negative    Posterior - negative  Other  Erythema: absent Scars: absent Sensation: normal Pulse: present Swelling: none Effusion: no effusion present   Left Knee Exam   Muscle Strength  The patient has normal left knee strength.  Tenderness  Left knee tenderness location: Posterior lateral knee.  Range of Motion  Extension: 5  Flexion: 120   Tests  McMurray:  Medial - negative Lateral - negative Varus: negative Valgus: negative Drawer:  Anterior - negative     Posterior - negative  Other  Erythema: absent Scars: absent Sensation: normal Pulse: present Swelling: none Effusion: no effusion present        MEDICAL DECISION  SECTION  (I read an x-ray independently, new problem, over-the-counter meds)  Xrays were done at 4 views left knee Aph   My independent reading of xrays:  No fracture but she does have medial joint line narrowing with obliteration of the joint alignment is still physiologic valgus, there are some areas of sclerosis without cyst formation that there is a joint effusion  Encounter Diagnosis  Name Primary?  . Acute pain of left knee Yes    PLAN: (Rx., injectx, surgery, frx, mri/ct) My examination does not find any major abnormalities other than pain along the lateral joint line.  Recommend hinged knee brace with economy hinged brace  Over-the-counter ibuprofen    No orders of the defined types were placed in this encounter.   Fuller CanadaStanley Sabri Teal, MD  06/20/2018 11:57 AM

## 2018-07-04 ENCOUNTER — Ambulatory Visit: Payer: Medicaid Other | Admitting: Orthopedic Surgery

## 2018-07-09 ENCOUNTER — Ambulatory Visit: Payer: Medicaid Other | Admitting: Orthopedic Surgery

## 2018-07-09 ENCOUNTER — Encounter: Payer: Self-pay | Admitting: Orthopedic Surgery

## 2020-09-27 ENCOUNTER — Encounter (INDEPENDENT_AMBULATORY_CARE_PROVIDER_SITE_OTHER): Payer: Self-pay | Admitting: *Deleted

## 2021-02-07 ENCOUNTER — Emergency Department (HOSPITAL_COMMUNITY): Payer: Medicaid Other

## 2021-02-07 ENCOUNTER — Emergency Department (HOSPITAL_COMMUNITY)
Admission: EM | Admit: 2021-02-07 | Discharge: 2021-02-07 | Disposition: A | Discharge status: Home / Self Care | Payer: Medicaid Other | Attending: Emergency Medicine | Admitting: Emergency Medicine

## 2021-02-07 ENCOUNTER — Other Ambulatory Visit: Payer: Self-pay

## 2021-02-07 ENCOUNTER — Encounter (HOSPITAL_COMMUNITY): Payer: Self-pay | Admitting: Emergency Medicine

## 2021-02-07 DIAGNOSIS — W228XXA Striking against or struck by other objects, initial encounter: Secondary | ICD-10-CM | POA: Diagnosis not present

## 2021-02-07 DIAGNOSIS — M25562 Pain in left knee: Secondary | ICD-10-CM

## 2021-02-07 DIAGNOSIS — S8992XA Unspecified injury of left lower leg, initial encounter: Secondary | ICD-10-CM | POA: Insufficient documentation

## 2021-02-07 DIAGNOSIS — Z79899 Other long term (current) drug therapy: Secondary | ICD-10-CM | POA: Diagnosis not present

## 2021-02-07 NOTE — ED Notes (Signed)
Patient transported to X-ray 

## 2021-02-07 NOTE — Discharge Instructions (Addendum)
You have been seen here for knee pain, I recommend taking over-the-counter pain medications like ibuprofen and/or Tylenol every 6 as needed.  Please follow dosage and on the back of bottle.  I also recommend applying heat to the area and stretching out the muscles as this will help decrease stiffness and pain.    If symptoms do not resolve after 1 to 2 weeks please follow-up with orthopedics and your PCP for reevaluation  Come back to the emergency department if you develop chest pain, shortness of breath, severe abdominal pain, uncontrolled nausea, vomiting, diarrhea.

## 2021-02-07 NOTE — ED Provider Notes (Signed)
[facility] EMERGENCY DEPARTMENT Provider Note   CSN: [id number] Arrival date & time: 02/07/21  1356     History  Chief Complaint  Patient presents with   Leg Injury    Gabriella Hodges is a 61 y.o. female.  HPI  Patient out significant medical history presents with complaints of left knee pain, patient has knee pain started today, states she was trying to set down her grandchild on the floor but her knee hit against the rocking chair.  She states she has severe pain on the lateral aspect of her left knee, pain will radiate down to her ankle, unable to bear weight on it, she denies any paresthesia or weakness in her foot, she states she had no pain prior to the incident, able to move her toes and ankle without difficulty able to bend her knee but has pain when she applies pressure, has taken ibuprofen with some relief, has only complaints.  Home Medications Prior to Admission medications   Medication Sig Start Date End Date Taking? Authorizing Provider  ALPRAZolam Prudy Feeler) 0.5 MG tablet Take 0.5 mg by mouth 3 (three) times daily as needed. anxiety     [provider]  Calcium Carbonate-Vitamin D (CALCIUM 600+D) 600-200 MG-UNIT TABS Take 2 tablets by mouth 2 (two) times daily.      [provider]  ferrous sulfate 325 (65 FE) MG tablet Take 325 mg by mouth daily.      [provider]  levothyroxine (SYNTHROID, LEVOTHROID) 175 MCG tablet Take 175 mcg by mouth daily.    [provider]  Multiple Vitamin (MULTIVITAMIN) tablet Take 1 tablet by mouth 2 (two) times daily.     [provider]  omeprazole (PRILOSEC) 40 MG capsule Take 1 capsule (40 mg total) by mouth daily. 12/20/11   Christiane Ha, MD  PHENobarbital (LUMINAL) 97.2 MG tablet Take 97.2 mg by mouth at bedtime.      [provider]  vitamin B-12 (CYANOCOBALAMIN) 1000 MCG tablet Take 1,000 mcg by mouth daily.    [provider]      Allergies     Shellfish-derived products, Nsaids, Penicillins, and Sulfa antibiotics    Review of Systems   Review of Systems  Constitutional:  Negative for chills and fever.  Respiratory:  Negative for shortness of breath.   Cardiovascular:  Negative for chest pain.  Gastrointestinal:  Negative for abdominal pain.  Musculoskeletal:        Left knee pain  Neurological:  Negative for headaches.   Physical Exam Updated Vital Signs BP 113/70 (BP Location: Left Arm)    Pulse (!) 59    Temp 98.4 F (36.9 C)    Resp 16    SpO2 100%  Physical Exam Vitals and nursing note reviewed.  Constitutional:      General: She is not in acute distress.    Appearance: She is not ill-appearing.  HENT:     Head: Normocephalic and atraumatic.     Nose: No congestion.  Eyes:     Conjunctiva/sclera: Conjunctivae normal.  Cardiovascular:     Rate and Rhythm: Normal rate and regular rhythm.     Pulses: Normal pulses.  Pulmonary:     Effort: Pulmonary effort is normal.  Musculoskeletal:     Comments: Left leg was visualized no edema or erythema present, no overlying skin changes, able to move her toes ankle and knee, neurovascular fully intact, she is notably tender on the proximal end of her fibula no  crepitus or deformities noted, no palpable cords no calf tenderness.  Skin:    General: Skin is warm and dry.  Neurological:     Mental Status: She is alert.  Psychiatric:        Mood and Affect: Mood normal.    ED Results / Procedures / Treatments   Labs (all labs ordered are listed, but only abnormal results are displayed) Labs Reviewed - No data to display  EKG None  Radiology DG Knee Complete 4 Views Left  Result Date: 02/07/2021 CLINICAL DATA:  Trauma left lateral upper leg and knee, inability to ambulate, bruising EXAM: LEFT KNEE - COMPLETE 4+ VIEW COMPARISON:  06/15/2018 FINDINGS: Frontal, bilateral oblique, and lateral views of the left knee are obtained. The bones are severely osteopenic. No  fracture, subluxation, or dislocation. There is moderate 3 compartmental osteoarthritis greatest in the medial compartment. No joint effusion. The soft tissues are unremarkable. IMPRESSION: 1. Moderate 3 compartmental osteoarthritis. 2. No acute displaced fracture. 3. Osteopenia. Electronically Signed   By: Sharlet Salina M.D.   On: 02/07/2021 16:48    Procedures Procedures    Medications Ordered in ED Medications - No data to display  ED Course/ Medical Decision Making/ A&P                           Medical Decision Making Amount and/or Complexity of Data Reviewed Radiology: ordered.   This patient presents to the ED for concern of knee pain, this involves an extensive number of treatment options, and is a complaint that carries with it a high risk of complications and morbidity.  The differential diagnosis includes fracture, dislocation, DVT    Additional history obtained:  Additional history obtained husband who is at bedside    Co morbidities that complicate the patient evaluation  N/A  Social Determinants of Health:  N/A    Lab Tests:  I Ordered, and personally interpreted labs.  The pertinent results include: N/A   Imaging Studies ordered:  I ordered imaging studies including x-ray of left knee I independently visualized and interpreted imaging which showed negative for acute findings shows advanced arthritis. I agree with the radiologist interpretation   Rule out I have low suspicion for septic arthritis as patient denies IV drug use, skin exam was performed no erythematous, edematous, warm joints noted on exam.  Low suspicion for fracture or dislocation as x-ray does not feel any significant findings. low suspicion t tendon damage as area was palpated no gross defects noted, they had full range of motion as well as 5/5 strength.  Low suspicion for compartment syndrome as area was palpated it was soft to the touch, neurovascular fully intact.  Low suspicion  for DVT as patient is atypical pain started initially after a trauma, no calf tenderness no palpable cords.     Dispostion and problem list  After consideration of the diagnostic results and the patients response to treatment, I feel that the patent would benefit from knee sleeve, NSAIDs follow-up with PCP.  Knee pain-likely muscular strain, will recommend symptom management, provide her with a knee sleeve, follow-up with PCP and orthopedics in 1 to 2 weeks symptoms not fully resolved.            Final Clinical Impression(s) / ED Diagnoses Final diagnoses:  Acute pain of left knee    Rx / DC Orders ED Discharge Orders     None  Carroll SageFaulkner, Ozzie Knobel J, PA-C 02/07/21 1750    Benjiman CorePickering, Nathan, MD 02/08/21 1022

## 2021-02-07 NOTE — ED Triage Notes (Signed)
Pt hit left lateral upper leg/knee area on rocking chair. States cant walk on it. Mild bruising noted. Nad.

## 2021-02-09 ENCOUNTER — Ambulatory Visit (INDEPENDENT_AMBULATORY_CARE_PROVIDER_SITE_OTHER): Payer: Medicaid Other | Admitting: Gastroenterology

## 2021-07-24 ENCOUNTER — Other Ambulatory Visit (HOSPITAL_COMMUNITY): Payer: Self-pay | Admitting: Family Medicine

## 2021-07-24 DIAGNOSIS — Z1231 Encounter for screening mammogram for malignant neoplasm of breast: Secondary | ICD-10-CM

## 2021-07-27 ENCOUNTER — Encounter: Payer: Self-pay | Admitting: *Deleted

## 2021-07-31 ENCOUNTER — Ambulatory Visit (HOSPITAL_COMMUNITY)
Admission: RE | Admit: 2021-07-31 | Discharge: 2021-07-31 | Disposition: A | Discharge status: Home / Self Care | Payer: Medicaid Other | Source: Ambulatory Visit | Attending: Family Medicine | Admitting: Family Medicine

## 2021-07-31 DIAGNOSIS — Z1231 Encounter for screening mammogram for malignant neoplasm of breast: Secondary | ICD-10-CM | POA: Diagnosis present

## 2021-08-22 ENCOUNTER — Ambulatory Visit: Payer: Medicaid Other | Admitting: Obstetrics & Gynecology

## 2021-09-15 NOTE — Progress Notes (Unsigned)
[facility] [phone]   [phone]  INITIAL CONSULT NOTE  Patient Care Team: Assunta Found, MD as PCP - General (Family Medicine)  Hematological/Oncological History 1) Labs from PCP, Dr. Assunta Found:  -07/25/2021: WBC 5.1, Hgb 10.4 (L), MCV 89, Plt 264, Creatinine 0.67, Vitamin B12 >2000, Folate 4.8, Ferritin 9 (L), Iron 28, Iron saturation 7% (L)  2) 09/18/2021: Establish care with [facility] Hematology/Oncology  CHIEF COMPLAINTS/PURPOSE OF CONSULTATION:  Normocytic anemia   HISTORY OF PRESENTING ILLNESS:  Gabriella Hodges 61 y.o. female with medical history significant for hypothyroidism, gastric bypass presents for normocytic anemia.   On review of the previous records ***  On exam today ***  MEDICAL HISTORY:  Past Medical History:  Diagnosis Date   Anxiety    Epilepsy ([facility])    Hypothyroidism    Thyroid disease     SURGICAL HISTORY: Past Surgical History:  Procedure Laterality Date   GASTRIC BYPASS     tumors      SOCIAL HISTORY: Social History   Socioeconomic History   Marital status: Married    Spouse name: Not on file   Number of children: Not on file   Years of education: 11th grade   Highest education level: Not on file  Occupational History   Occupation: none  Tobacco Use   Smoking status: Every Day    Packs/day: 1.00    Types: Cigarettes   Smokeless tobacco: Never  Substance and Sexual Activity   Alcohol use: No   Drug use: No   Sexual activity: Not on file  Other Topics Concern   Not on file  Social History Narrative   Not on file   Social Determinants of Health   Financial Resource Strain: Not on file  Food Insecurity: Not on file  Transportation Needs: Not on file  Physical Activity: Not on file  Stress: Not on file  Social Connections: Not on file  Intimate Partner Violence: Not on file    FAMILY HISTORY: Family History  Problem Relation Age of Onset   Heart disease Other    Lung disease  Other    Cancer Other    Diabetes Other     ALLERGIES:  is allergic to shellfish-derived products, nsaids, penicillins, and sulfa antibiotics.  MEDICATIONS:  Current Outpatient Medications  Medication Sig Dispense Refill   ALPRAZolam (XANAX) 0.5 MG tablet Take 0.5 mg by mouth 3 (three) times daily as needed. anxiety      Calcium Carbonate-Vitamin D (CALCIUM 600+D) 600-200 MG-UNIT TABS Take 2 tablets by mouth 2 (two) times daily.       ferrous sulfate 325 (65 FE) MG tablet Take 325 mg by mouth daily.       levothyroxine (SYNTHROID, LEVOTHROID) 175 MCG tablet Take 175 mcg by mouth daily.     Multiple Vitamin (MULTIVITAMIN) tablet Take 1 tablet by mouth 2 (two) times daily.      omeprazole (PRILOSEC) 40 MG capsule Take 1 capsule (40 mg total) by mouth daily. 30 capsule 0   PHENobarbital (LUMINAL) 97.2 MG tablet Take 97.2 mg by mouth at bedtime.       vitamin B-12 (CYANOCOBALAMIN) 1000 MCG tablet Take 1,000 mcg by mouth daily.     No current facility-administered medications for this visit.    REVIEW OF SYSTEMS:   Constitutional: ( - ) fevers, ( - )  chills , ( - ) night sweats Eyes: ( - ) blurriness of vision, ( - ) double vision, ( - ) watery  eyes Ears, nose, mouth, throat, and face: ( - ) mucositis, ( - ) sore throat Respiratory: ( - ) cough, ( - ) dyspnea, ( - ) wheezes Cardiovascular: ( - ) palpitation, ( - ) chest discomfort, ( - ) lower extremity swelling Gastrointestinal:  ( - ) nausea, ( - ) heartburn, ( - ) change in bowel habits Skin: ( - ) abnormal skin rashes Lymphatics: ( - ) new lymphadenopathy, ( - ) easy bruising Neurological: ( - ) numbness, ( - ) tingling, ( - ) new weaknesses Behavioral/Psych: ( - ) mood change, ( - ) new changes  All other systems were reviewed with the patient and are negative.  PHYSICAL EXAMINATION: ECOG PERFORMANCE STATUS: {CHL ONC ECOG [id number]}  There were no vitals filed for this visit. There were no vitals filed for this  visit.  GENERAL: well appearing *** in NAD  SKIN: skin color, texture, turgor are normal, no rashes or significant lesions EYES: conjunctiva are pink and non-injected, sclera clear OROPHARYNX: no exudate, no erythema; lips, buccal mucosa, and tongue normal  NECK: supple, non-tender LYMPH:  no palpable lymphadenopathy in the cervical, axillary or supraclavicular lymph nodes.  LUNGS: clear to auscultation and percussion with normal breathing effort HEART: regular rate & rhythm and no murmurs and no lower extremity edema ABDOMEN: soft, non-tender, non-distended, normal bowel sounds Musculoskeletal: no cyanosis of digits and no clubbing  PSYCH: alert & oriented x 3, fluent speech NEURO: no focal motor/sensory deficits  LABORATORY DATA:  I have reviewed the data as listed    Latest Ref Rng & Units 12/19/2011    1:36 AM 09/10/2010    3:35 PM 03/02/2010   10:33 AM  CBC  WBC 4.0 - 10.5 K/uL 5.8  9.8  6.0   Hemoglobin 12.0 - 15.0 g/dL 40.9  81.1  91.4   Hematocrit 36.0 - 46.0 % 38.1  39.3  38.6   Platelets 150 - 400 K/uL 184  184  225        Latest Ref Rng & Units 12/19/2011    3:07 AM 12/19/2011    1:36 AM 09/10/2010    3:35 PM  CMP  Glucose 70 - 99 mg/dL  782  956   BUN 6 - 23 mg/dL  16  15   Creatinine 2.13 - 1.10 mg/dL  0.86  5.78   Sodium 469 - 145 mEq/L  138  136   Potassium 3.5 - 5.1 mEq/L  3.7  3.7   Chloride 96 - 112 mEq/L  104  101   CO2 19 - 32 mEq/L  26  28   Calcium 8.4 - 10.5 mg/dL  8.9  9.2   Total Protein 6.0 - 8.3 g/dL 6.4   6.6   Total Bilirubin 0.3 - 1.2 mg/dL 0.2   0.3   Alkaline Phos 39 - 117 U/L 88   100   AST 0 - 37 U/L 16   22   ALT 0 - 35 U/L 13   15      PATHOLOGY: ***  BLOOD FILM: *** Review of the peripheral blood smear showed normal appearing white cells with neutrophils that were appropriately lobated and granulated. There was no predominance of bi-lobed or hyper-segmented neutrophils appreciated. No Dohle bodies were noted. There was no left  shifting, immature forms or blasts noted. Lymphocytes remain normal in size without any predominance of large granular lymphocytes. Red cells show no anisopoikilocytosis, macrocytes , microcytes or polychromasia. There were no schistocytes, target cells,  echinocytes, acanthocytes, dacrocytes, or stomatocytes.There was no rouleaux formation, nucleated red cells, or intra-cellular inclusions noted. The platelets are normal in size, shape, and color without any clumping evident.  RADIOGRAPHIC STUDIES: I have personally reviewed the radiological images as listed and agreed with the findings in the report. No results found.  ASSESSMENT & PLAN ***  No orders of the defined types were placed in this encounter.   All questions were answered. The patient knows to call the clinic with any problems, questions or concerns.  I have spent a total of {CHL ONC TIME VISIT - [id number]} minutes of face-to-face and non-face-to-face time, preparing to see the patient, obtaining and/or reviewing separately obtained history, performing a medically appropriate examination, counseling and educating the patient, ordering medications/tests/procedures, referring and communicating with other health care professionals, documenting clinical information in the electronic health record, independently interpreting results and communicating results to the patient, and care coordination.   Georga Kaufmann, PA-C Department of Hematology/Oncology [facility] Phone: [phone]

## 2021-09-18 ENCOUNTER — Inpatient Hospital Stay: Payer: Medicaid Other | Attending: Physician Assistant | Admitting: Physician Assistant

## 2021-09-18 ENCOUNTER — Inpatient Hospital Stay: Payer: Medicaid Other | Admitting: Physician Assistant

## 2021-09-18 ENCOUNTER — Encounter: Payer: Self-pay | Admitting: Physician Assistant

## 2021-09-18 VITALS — BP 150/70 | HR 62 | Temp 98.2°F | Resp 18 | Ht 62.99 in | Wt 164.1 lb

## 2021-09-18 DIAGNOSIS — Z882 Allergy status to sulfonamides status: Secondary | ICD-10-CM | POA: Insufficient documentation

## 2021-09-18 DIAGNOSIS — Z8249 Family history of ischemic heart disease and other diseases of the circulatory system: Secondary | ICD-10-CM | POA: Diagnosis not present

## 2021-09-18 DIAGNOSIS — R110 Nausea: Secondary | ICD-10-CM | POA: Insufficient documentation

## 2021-09-18 DIAGNOSIS — Z79899 Other long term (current) drug therapy: Secondary | ICD-10-CM | POA: Diagnosis not present

## 2021-09-18 DIAGNOSIS — K921 Melena: Secondary | ICD-10-CM | POA: Diagnosis not present

## 2021-09-18 DIAGNOSIS — Z9884 Bariatric surgery status: Secondary | ICD-10-CM | POA: Diagnosis not present

## 2021-09-18 DIAGNOSIS — Z809 Family history of malignant neoplasm, unspecified: Secondary | ICD-10-CM | POA: Diagnosis not present

## 2021-09-18 DIAGNOSIS — Z79890 Hormone replacement therapy (postmenopausal): Secondary | ICD-10-CM | POA: Diagnosis not present

## 2021-09-18 DIAGNOSIS — D509 Iron deficiency anemia, unspecified: Secondary | ICD-10-CM | POA: Insufficient documentation

## 2021-09-18 DIAGNOSIS — E039 Hypothyroidism, unspecified: Secondary | ICD-10-CM | POA: Diagnosis not present

## 2021-09-18 DIAGNOSIS — F17210 Nicotine dependence, cigarettes, uncomplicated: Secondary | ICD-10-CM | POA: Insufficient documentation

## 2021-09-18 DIAGNOSIS — R0602 Shortness of breath: Secondary | ICD-10-CM | POA: Diagnosis not present

## 2021-09-18 DIAGNOSIS — Z836 Family history of other diseases of the respiratory system: Secondary | ICD-10-CM | POA: Insufficient documentation

## 2021-09-18 DIAGNOSIS — R5382 Chronic fatigue, unspecified: Secondary | ICD-10-CM | POA: Insufficient documentation

## 2021-09-18 DIAGNOSIS — E538 Deficiency of other specified B group vitamins: Secondary | ICD-10-CM | POA: Insufficient documentation

## 2021-09-18 DIAGNOSIS — Z886 Allergy status to analgesic agent status: Secondary | ICD-10-CM | POA: Insufficient documentation

## 2021-09-18 DIAGNOSIS — D508 Other iron deficiency anemias: Secondary | ICD-10-CM

## 2021-09-18 DIAGNOSIS — K30 Functional dyspepsia: Secondary | ICD-10-CM | POA: Diagnosis not present

## 2021-09-18 DIAGNOSIS — R0600 Dyspnea, unspecified: Secondary | ICD-10-CM | POA: Insufficient documentation

## 2021-09-18 DIAGNOSIS — Z880 Allergy status to penicillin: Secondary | ICD-10-CM | POA: Insufficient documentation

## 2021-09-18 DIAGNOSIS — Z833 Family history of diabetes mellitus: Secondary | ICD-10-CM | POA: Insufficient documentation

## 2021-09-18 LAB — COMPREHENSIVE METABOLIC PANEL
ALT: 10 U/L (ref 0–44)
AST: 12 U/L — ABNORMAL LOW (ref 15–41)
Albumin: 3.7 g/dL (ref 3.5–5.0)
Alkaline Phosphatase: 82 U/L (ref 38–126)
Anion gap: 7 (ref 5–15)
BUN: 18 mg/dL (ref 8–23)
CO2: 21 mmol/L — ABNORMAL LOW (ref 22–32)
Calcium: 8.3 mg/dL — ABNORMAL LOW (ref 8.9–10.3)
Chloride: 111 mmol/L (ref 98–111)
Creatinine, Ser: 0.7 mg/dL (ref 0.44–1.00)
GFR, Estimated: >60 mL/min (ref >60)
Glucose, Bld: 93 mg/dL (ref 70–99)
Potassium: 3.5 mmol/L (ref 3.5–5.1)
Sodium: 139 mmol/L (ref 135–145)
Total Bilirubin: 0.6 mg/dL (ref 0.3–1.2)
Total Protein: 6.4 g/dL — ABNORMAL LOW (ref 6.5–8.1)

## 2021-09-18 LAB — CBC WITH DIFFERENTIAL/PLATELET
Abs Immature Granulocytes: 0.01 10*3/uL (ref 0.00–0.07)
Basophils Absolute: 0.1 10*3/uL (ref 0.0–0.1)
Basophils Relative: 1 %
Eosinophils Absolute: 0.3 10*3/uL (ref 0.0–0.5)
Eosinophils Relative: 7 %
HCT: 38.7 % (ref 36.0–46.0)
Hemoglobin: 12.6 g/dL (ref 12.0–15.0)
Immature Granulocytes: 0 %
Lymphocytes Relative: 32 %
Lymphs Abs: 1.4 10*3/uL (ref 0.7–4.0)
MCH: 29.9 pg (ref 26.0–34.0)
MCHC: 32.6 g/dL (ref 30.0–36.0)
MCV: 91.7 fL (ref 80.0–100.0)
Monocytes Absolute: 0.3 10*3/uL (ref 0.1–1.0)
Monocytes Relative: 6 %
Neutro Abs: 2.4 10*3/uL (ref 1.7–7.7)
Neutrophils Relative %: 54 %
Platelets: 216 10*3/uL (ref 150–400)
RBC: 4.22 MIL/uL (ref 3.87–5.11)
RDW: 17.1 % — ABNORMAL HIGH (ref 11.5–15.5)
WBC: 4.5 10*3/uL (ref 4.0–10.5)
nRBC: 0 % (ref 0.0–0.2)

## 2021-09-18 LAB — RETIC PANEL
Immature Retic Fract: 8.9 % (ref 2.3–15.9)
RBC.: 4.14 MIL/uL (ref 3.87–5.11)
Retic Count, Absolute: 33.9 10*3/uL (ref 19.0–186.0)
Retic Ct Pct: 0.8 % (ref 0.4–3.1)
Reticulocyte Hemoglobin: 33.1 pg (ref >27.9)

## 2021-09-18 LAB — IRON AND TIBC
Iron: 66 ug/dL (ref 28–170)
Saturation Ratios: 18 % (ref 10.4–31.8)
TIBC: 363 ug/dL (ref 250–450)
UIBC: 297 ug/dL

## 2021-09-18 LAB — VITAMIN B12: Vitamin B-12: 4914 pg/mL — ABNORMAL HIGH (ref 180–914)

## 2021-09-18 LAB — FERRITIN: Ferritin: 11 ng/mL (ref 11–307)

## 2021-09-19 ENCOUNTER — Encounter: Payer: Self-pay | Admitting: *Deleted

## 2021-09-19 DIAGNOSIS — D509 Iron deficiency anemia, unspecified: Secondary | ICD-10-CM | POA: Insufficient documentation

## 2021-09-21 LAB — METHYLMALONIC ACID, SERUM: Methylmalonic Acid, Quantitative: 153 nmol/L (ref 0–378)

## 2021-09-25 ENCOUNTER — Inpatient Hospital Stay: Payer: Medicaid Other

## 2021-09-25 VITALS — BP 114/56 | HR 60 | Temp 97.8°F | Resp 18

## 2021-09-25 DIAGNOSIS — D509 Iron deficiency anemia, unspecified: Secondary | ICD-10-CM | POA: Diagnosis not present

## 2021-09-25 MED ORDER — SODIUM CHLORIDE 0.9 % IV SOLN: Freq: Once | INTRAVENOUS | Status: AC

## 2021-09-25 MED ORDER — SODIUM CHLORIDE 0.9 % IV SOLN
200.0000 mg | Freq: Once | INTRAVENOUS | Status: AC
  Administered 2021-09-25: 200 mg via INTRAVENOUS
  Filled 2021-09-25: qty 200

## 2021-09-25 MED ORDER — ACETAMINOPHEN 325 MG PO TABS
650.0000 mg | ORAL_TABLET | Freq: Once | ORAL | Status: AC
  Administered 2021-09-25: 650 mg via ORAL
  Filled 2021-09-25: qty 2

## 2021-09-25 MED ORDER — LORATADINE 10 MG PO TABS
10.0000 mg | ORAL_TABLET | Freq: Once | ORAL | Status: AC
  Administered 2021-09-25: 10 mg via ORAL
  Filled 2021-09-25: qty 1

## 2021-09-25 NOTE — Progress Notes (Signed)
Patient presents today for Venofer infusion per providers order.  Vital signs within parameters for treatment.  Patient has no new complaints at this time.    Peripheral IV started and blood return noted pre and post infusion.    Stable during infusion without adverse affects.  Vital signs stable.  No complaints at this time.  Discharge from clinic ambulatory in stable condition.  Alert and oriented X 3.  Follow up with [facility] as scheduled.  

## 2021-09-25 NOTE — Patient Instructions (Signed)
[facility]  Discharge Instructions: Thank you for choosing [facility] to provide your oncology and hematology care.  If you have a lab appointment with the Cancer Center, please come in thru the Main Entrance and check in at the main information desk.  Wear comfortable clothing and clothing appropriate for easy access to any Portacath or PICC line.   We strive to give you quality time with your provider. You may need to reschedule your appointment if you arrive late (15 or more minutes).  Arriving late affects you and other patients whose appointments are after yours.  Also, if you miss three or more appointments without notifying the office, you may be dismissed from the clinic at the provider's discretion.      For prescription refill requests, have your pharmacy contact our office and allow 72 hours for refills to be completed.    Today you received the following chemotherapy and/or immunotherapy agents Venofer      To help prevent nausea and vomiting after your treatment, we encourage you to take your nausea medication as directed.  BELOW ARE SYMPTOMS THAT SHOULD BE REPORTED IMMEDIATELY: *FEVER GREATER THAN 100.4 F (38 C) OR HIGHER *CHILLS OR SWEATING *NAUSEA AND VOMITING THAT IS NOT CONTROLLED WITH YOUR NAUSEA MEDICATION *UNUSUAL SHORTNESS OF BREATH *UNUSUAL BRUISING OR BLEEDING *URINARY PROBLEMS (pain or burning when urinating, or frequent urination) *BOWEL PROBLEMS (unusual diarrhea, constipation, pain near the anus) TENDERNESS IN MOUTH AND THROAT WITH OR WITHOUT PRESENCE OF ULCERS (sore throat, sores in mouth, or a toothache) UNUSUAL RASH, SWELLING OR PAIN  UNUSUAL VAGINAL DISCHARGE OR ITCHING   Items with * indicate a potential emergency and should be followed up as soon as possible or go to the Emergency Department if any problems should occur.  Please show the CHEMOTHERAPY ALERT CARD or IMMUNOTHERAPY ALERT CARD at check-in to the Emergency  Department and triage nurse.  Should you have questions after your visit or need to cancel or reschedule your appointment, please contact [facility] [phone]  and follow the prompts.  Office hours are 8:00 a.m. to 4:30 p.m. Monday - Friday. Please note that voicemails left after 4:00 p.m. may not be returned until the following business day.  We are closed weekends and major holidays. You have access to a nurse at all times for urgent questions. Please call the main number to the clinic [phone] and follow the prompts.  For any non-urgent questions, you may also contact your provider using MyChart. We now offer e-Visits for anyone 18 and older to request care online for non-urgent symptoms. For details visit [email or web address].   Also download the MyChart app! Go to the app store, search "MyChart", open the app, select [facility], and log in with your MyChart username and password.  Masks are optional in the cancer centers. If you would like for your care team to wear a mask while they are taking care of you, please let them know. You may have one support person who is at least 62 years old accompany you for your appointments.  

## 2021-09-27 ENCOUNTER — Encounter: Payer: Self-pay | Admitting: Hematology

## 2021-09-27 NOTE — Progress Notes (Signed)
Opened in error

## 2021-10-02 ENCOUNTER — Inpatient Hospital Stay: Payer: Medicaid Other

## 2021-10-02 VITALS — BP 103/49 | HR 61 | Temp 98.3°F | Resp 18

## 2021-10-02 DIAGNOSIS — D509 Iron deficiency anemia, unspecified: Secondary | ICD-10-CM | POA: Diagnosis not present

## 2021-10-02 MED ORDER — SODIUM CHLORIDE 0.9 % IV SOLN
200.0000 mg | Freq: Once | INTRAVENOUS | Status: AC
  Administered 2021-10-02: 200 mg via INTRAVENOUS
  Filled 2021-10-02: qty 10

## 2021-10-02 MED ORDER — LORATADINE 10 MG PO TABS
10.0000 mg | ORAL_TABLET | Freq: Once | ORAL | Status: AC
  Administered 2021-10-02: 10 mg via ORAL
  Filled 2021-10-02: qty 1

## 2021-10-02 MED ORDER — SODIUM CHLORIDE 0.9 % IV SOLN: Freq: Once | INTRAVENOUS | Status: AC

## 2021-10-02 MED ORDER — ACETAMINOPHEN 325 MG PO TABS
650.0000 mg | ORAL_TABLET | Freq: Once | ORAL | Status: AC
  Administered 2021-10-02: 650 mg via ORAL
  Filled 2021-10-02: qty 2

## 2021-10-02 NOTE — Progress Notes (Signed)
Patient tolerated iron infusion with no complaints voiced.  Peripheral IV site clean and dry with good blood return noted before and after infusion.  Band aid applied.  VSS with discharge and left in satisfactory condition with no s/s of distress noted.   

## 2021-10-02 NOTE — Patient Instructions (Signed)
[facility]  Discharge Instructions: Thank you for choosing [facility] to provide your oncology and hematology care.  If you have a lab appointment with the Cancer Center, please come in thru the Main Entrance and check in at the main information desk.  Wear comfortable clothing and clothing appropriate for easy access to any Portacath or PICC line.   We strive to give you quality time with your provider. You may need to reschedule your appointment if you arrive late (15 or more minutes).  Arriving late affects you and other patients whose appointments are after yours.  Also, if you miss three or more appointments without notifying the office, you may be dismissed from the clinic at the provider's discretion.      For prescription refill requests, have your pharmacy contact our office and allow 72 hours for refills to be completed.     To help prevent nausea and vomiting after your treatment, we encourage you to take your nausea medication as directed.  BELOW ARE SYMPTOMS THAT SHOULD BE REPORTED IMMEDIATELY: *FEVER GREATER THAN 100.4 F (38 C) OR HIGHER *CHILLS OR SWEATING *NAUSEA AND VOMITING THAT IS NOT CONTROLLED WITH YOUR NAUSEA MEDICATION *UNUSUAL SHORTNESS OF BREATH *UNUSUAL BRUISING OR BLEEDING *URINARY PROBLEMS (pain or burning when urinating, or frequent urination) *BOWEL PROBLEMS (unusual diarrhea, constipation, pain near the anus) TENDERNESS IN MOUTH AND THROAT WITH OR WITHOUT PRESENCE OF ULCERS (sore throat, sores in mouth, or a toothache) UNUSUAL RASH, SWELLING OR PAIN  UNUSUAL VAGINAL DISCHARGE OR ITCHING   Items with * indicate a potential emergency and should be followed up as soon as possible or go to the Emergency Department if any problems should occur.  Please show the CHEMOTHERAPY ALERT CARD or IMMUNOTHERAPY ALERT CARD at check-in to the Emergency Department and triage nurse.  Should you have questions after your visit or need to  cancel or reschedule your appointment, please contact [facility] [phone]  and follow the prompts.  Office hours are 8:00 a.m. to 4:30 p.m. Monday - Friday. Please note that voicemails left after 4:00 p.m. may not be returned until the following business day.  We are closed weekends and major holidays. You have access to a nurse at all times for urgent questions. Please call the main number to the clinic [phone] and follow the prompts.  For any non-urgent questions, you may also contact your provider using MyChart. We now offer e-Visits for anyone 18 and older to request care online for non-urgent symptoms. For details visit [email or web address].   Also download the MyChart app! Go to the app store, search "MyChart", open the app, select [name], and log in with your MyChart username and password.  Masks are optional in the cancer centers. If you would like for your care team to wear a mask while they are taking care of you, please let them know. You may have one support person who is at least 62 years old accompany you for your appointments.  

## 2021-10-08 NOTE — Progress Notes (Unsigned)
GI Office Note    Referring Provider: Assunta Found, MD Primary Care Physician:  Assunta Found, MD  Primary Gastroenterologist:  Chief Complaint   No chief complaint on file.    History of Present Illness   Gabriella Hodges is a 61 y.o. female presenting today at the request of Dr. Phillips Odor for further evaluation of anemia.  Labs from 09/2021: iron 66, TIBC 363, iron sat 18, ferritin 11, H/H 12.6/38.7, platelet 216, WBC 4.5  EGD 2006: sliding hiatal hernia, normal esophagus, esophagus dilation.       Medications   Current Outpatient Medications  Medication Sig Dispense Refill   ALPRAZolam (XANAX) 0.5 MG tablet Take 0.5 mg by mouth 3 (three) times daily as needed. anxiety      Calcium Carbonate-Vitamin D (CALCIUM 600+D) 600-200 MG-UNIT TABS Take 2 tablets by mouth 2 (two) times daily.       ferrous sulfate 325 (65 FE) MG tablet Take 325 mg by mouth daily.       levothyroxine (SYNTHROID, LEVOTHROID) 175 MCG tablet Take 175 mcg by mouth daily.     omeprazole (PRILOSEC) 40 MG capsule Take 1 capsule (40 mg total) by mouth daily. 30 capsule 0   PHENobarbital (LUMINAL) 97.2 MG tablet Take 97.2 mg by mouth at bedtime.       vitamin B-12 (CYANOCOBALAMIN) 1000 MCG tablet Take 1,000 mcg by mouth daily.     No current facility-administered medications for this visit.    Allergies   Allergies as of 10/09/2021 - Review Complete 10/02/2021  Allergen Reaction Noted   Shellfish-derived products Anaphylaxis 05/04/2010   Nsaids Hives 05/04/2010   Penicillins Hives 05/04/2010   Sulfa antibiotics Hives 05/04/2010    Past Medical History   Past Medical History:  Diagnosis Date   Anxiety    Epilepsy (HCC)    Hypothyroidism    Thyroid disease     Past Surgical History   Past Surgical History:  Procedure Laterality Date   GASTRIC BYPASS     tumors     breast-benign    Past Family History   Family History  Problem Relation Age of Onset   Heart disease Other     Lung disease Other    Cancer Other    Diabetes Other     Past Social History   Social History   Socioeconomic History   Marital status: Married    Spouse name: Not on file   Number of children: Not on file   Years of education: 11th grade   Highest education level: Not on file  Occupational History   Occupation: none  Tobacco Use   Smoking status: Every Day    Packs/day: 0.50    Years: 30.00    Total pack years: 15.00    Types: Cigarettes   Smokeless tobacco: Never  Vaping Use   Vaping Use: Never used  Substance and Sexual Activity   Alcohol use: No   Drug use: No   Sexual activity: Not on file  Other Topics Concern   Not on file  Social History Narrative   Not on file   Social Determinants of Health   Financial Resource Strain: Not on file  Food Insecurity: Not on file  Transportation Needs: Not on file  Physical Activity: Not on file  Stress: Not on file  Social Connections: Not on file  Intimate Partner Violence: Not on file    Review of Systems   General: Negative for anorexia, weight loss, fever, chills,  fatigue, weakness. Eyes: Negative for vision changes.  ENT: Negative for hoarseness, difficulty swallowing , nasal congestion. CV: Negative for chest pain, angina, palpitations, dyspnea on exertion, peripheral edema.  Respiratory: Negative for dyspnea at rest, dyspnea on exertion, cough, sputum, wheezing.  GI: See history of present illness. GU:  Negative for dysuria, hematuria, urinary incontinence, urinary frequency, nocturnal urination.  MS: Negative for joint pain, low back pain.  Derm: Negative for rash or itching.  Neuro: Negative for weakness, abnormal sensation, seizure, frequent headaches, memory loss,  confusion.  Psych: Negative for anxiety, depression, suicidal ideation, hallucinations.  Endo: Negative for unusual weight change.  Heme: Negative for bruising or bleeding. Allergy: Negative for rash or hives.  Physical Exam   There were  no vitals taken for this visit.   General: Well-nourished, well-developed in no acute distress.  Head: Normocephalic, atraumatic.   Eyes: Conjunctiva pink, no icterus. Mouth: Oropharyngeal mucosa moist and pink , no lesions erythema or exudate. Neck: Supple without thyromegaly, masses, or lymphadenopathy.  Lungs: Clear to auscultation bilaterally.  Heart: Regular rate and rhythm, no murmurs rubs or gallops.  Abdomen: Bowel sounds are normal, nontender, nondistended, no hepatosplenomegaly or masses,  no abdominal bruits or hernia, no rebound or guarding.   Rectal: *** Extremities: No lower extremity edema. No clubbing or deformities.  Neuro: Alert and oriented x 4 , grossly normal neurologically.  Skin: Warm and dry, no rash or jaundice.   Psych: Alert and cooperative, normal mood and affect.  Labs   Lab Results  Component Value Date   CREATININE 0.70 09/18/2021   BUN 18 09/18/2021   NA 139 09/18/2021   K 3.5 09/18/2021   CL 111 09/18/2021   CO2 21 (L) 09/18/2021   Lab Results  Component Value Date   ALT 10 09/18/2021   AST 12 (L) 09/18/2021   ALKPHOS 82 09/18/2021   BILITOT 0.6 09/18/2021   Lab Results  Component Value Date   WBC 4.5 09/18/2021   HGB 12.6 09/18/2021   HCT 38.7 09/18/2021   MCV 91.7 09/18/2021   PLT 216 09/18/2021   Lab Results  Component Value Date   IRON 66 09/18/2021   TIBC 363 09/18/2021   FERRITIN 11 09/18/2021   Lab Results  Component Value Date   VITAMINB12 4,914 (H) 09/18/2021    Imaging Studies   No results found.  Assessment       PLAN   ***   Laureen Ochs. Bobby Rumpf, [place], [facility]

## 2021-10-09 ENCOUNTER — Inpatient Hospital Stay: Payer: Medicaid Other | Attending: Hematology

## 2021-10-09 ENCOUNTER — Ambulatory Visit (INDEPENDENT_AMBULATORY_CARE_PROVIDER_SITE_OTHER): Payer: Medicaid Other | Admitting: Gastroenterology

## 2021-10-09 ENCOUNTER — Encounter: Payer: Self-pay | Admitting: Gastroenterology

## 2021-10-09 VITALS — BP 106/67 | HR 51 | Temp 97.9°F | Resp 18

## 2021-10-09 VITALS — BP 120/67 | HR 71 | Temp 98.1°F | Ht 63.0 in | Wt 164.8 lb

## 2021-10-09 DIAGNOSIS — F17210 Nicotine dependence, cigarettes, uncomplicated: Secondary | ICD-10-CM | POA: Diagnosis not present

## 2021-10-09 DIAGNOSIS — Z8249 Family history of ischemic heart disease and other diseases of the circulatory system: Secondary | ICD-10-CM | POA: Diagnosis not present

## 2021-10-09 DIAGNOSIS — D509 Iron deficiency anemia, unspecified: Secondary | ICD-10-CM

## 2021-10-09 DIAGNOSIS — Z809 Family history of malignant neoplasm, unspecified: Secondary | ICD-10-CM | POA: Insufficient documentation

## 2021-10-09 DIAGNOSIS — Z79899 Other long term (current) drug therapy: Secondary | ICD-10-CM | POA: Insufficient documentation

## 2021-10-09 DIAGNOSIS — Z833 Family history of diabetes mellitus: Secondary | ICD-10-CM | POA: Diagnosis not present

## 2021-10-09 DIAGNOSIS — K219 Gastro-esophageal reflux disease without esophagitis: Secondary | ICD-10-CM | POA: Diagnosis not present

## 2021-10-09 DIAGNOSIS — R1310 Dysphagia, unspecified: Secondary | ICD-10-CM | POA: Insufficient documentation

## 2021-10-09 DIAGNOSIS — R1013 Epigastric pain: Secondary | ICD-10-CM | POA: Insufficient documentation

## 2021-10-09 DIAGNOSIS — R5383 Other fatigue: Secondary | ICD-10-CM | POA: Diagnosis not present

## 2021-10-09 DIAGNOSIS — K625 Hemorrhage of anus and rectum: Secondary | ICD-10-CM | POA: Diagnosis not present

## 2021-10-09 DIAGNOSIS — Z836 Family history of other diseases of the respiratory system: Secondary | ICD-10-CM | POA: Insufficient documentation

## 2021-10-09 DIAGNOSIS — R1319 Other dysphagia: Secondary | ICD-10-CM | POA: Insufficient documentation

## 2021-10-09 DIAGNOSIS — E538 Deficiency of other specified B group vitamins: Secondary | ICD-10-CM | POA: Diagnosis not present

## 2021-10-09 MED ORDER — LORATADINE 10 MG PO TABS
10.0000 mg | ORAL_TABLET | Freq: Once | ORAL | Status: AC
  Administered 2021-10-09: 10 mg via ORAL
  Filled 2021-10-09: qty 1

## 2021-10-09 MED ORDER — SODIUM CHLORIDE 0.9 % IV SOLN: Freq: Once | INTRAVENOUS | Status: AC

## 2021-10-09 MED ORDER — ACETAMINOPHEN 325 MG PO TABS
650.0000 mg | ORAL_TABLET | Freq: Once | ORAL | Status: AC
  Administered 2021-10-09: 650 mg via ORAL
  Filled 2021-10-09: qty 2

## 2021-10-09 MED ORDER — SODIUM CHLORIDE 0.9 % IV SOLN
200.0000 mg | Freq: Once | INTRAVENOUS | Status: AC
  Administered 2021-10-09: 200 mg via INTRAVENOUS
  Filled 2021-10-09: qty 10

## 2021-10-09 NOTE — Patient Instructions (Signed)
Continue omeprazole 40mg  daily.  Colonoscopy with upper endoscopy in near future. We have made notation, to only stretch esophagus if stricture seen.

## 2021-10-09 NOTE — Patient Instructions (Signed)
[facility] [place]  Discharge Instructions: Thank you for choosing [facility] to provide your oncology and hematology care.  If you have a lab appointment with the Cancer Center, please come in thru the Main Entrance and check in at the main information desk.  Wear comfortable clothing and clothing appropriate for easy access to any Portacath or PICC line.   We strive to give you quality time with your provider. You may need to reschedule your appointment if you arrive late (15 or more minutes).  Arriving late affects you and other patients whose appointments are after yours.  Also, if you miss three or more appointments without notifying the office, you may be dismissed from the clinic at the provider's discretion.      For prescription refill requests, have your pharmacy contact our office and allow 72 hours for refills to be completed.    Today you received the following Venofer, return as scheduled.   To help prevent nausea and vomiting after your treatment, we encourage you to take your nausea medication as directed.  BELOW ARE SYMPTOMS THAT SHOULD BE REPORTED IMMEDIATELY: *FEVER GREATER THAN 100.4 F (38 C) OR HIGHER *CHILLS OR SWEATING *NAUSEA AND VOMITING THAT IS NOT CONTROLLED WITH YOUR NAUSEA MEDICATION *UNUSUAL SHORTNESS OF BREATH *UNUSUAL BRUISING OR BLEEDING *URINARY PROBLEMS (pain or burning when urinating, or frequent urination) *BOWEL PROBLEMS (unusual diarrhea, constipation, pain near the anus) TENDERNESS IN MOUTH AND THROAT WITH OR WITHOUT PRESENCE OF ULCERS (sore throat, sores in mouth, or a toothache) UNUSUAL RASH, SWELLING OR PAIN  UNUSUAL VAGINAL DISCHARGE OR ITCHING   Items with * indicate a potential emergency and should be followed up as soon as possible or go to the Emergency Department if any problems should occur.  Please show the CHEMOTHERAPY ALERT CARD or IMMUNOTHERAPY ALERT CARD at check-in to the Emergency Department and triage  nurse.  Should you have questions after your visit or need to cancel or reschedule your appointment, please contact [facility] [phone]  and follow the prompts.  Office hours are 8:00 a.m. to 4:30 p.m. Monday - Friday. Please note that voicemails left after 4:00 p.m. may not be returned until the following business day.  We are closed weekends and major holidays. You have access to a nurse at all times for urgent questions. Please call the main number to the clinic [phone] and follow the prompts.  For any non-urgent questions, you may also contact your provider using MyChart. We now offer e-Visits for anyone 18 and older to request care online for non-urgent symptoms. For details visit [email or web address].   Also download the MyChart app! Go to the app store, search "MyChart", open the app, select [facility], and log in with your MyChart username and password.  Masks are optional in the cancer centers. If you would like for your care team to wear a mask while they are taking care of you, please let them know. You may have one support person who is at least 62 years old accompany you for your appointments.  

## 2021-10-09 NOTE — Progress Notes (Signed)
Patient tolerated iron infusion with no complaints voiced. Patient refusing to wait 30 minute recommended wait time. Peripheral IV site clean and dry with good blood return noted before and after infusion. Band aid applied. VSS with discharge and left in satisfactory condition with no s/s of distress noted.

## 2021-10-12 ENCOUNTER — Encounter: Payer: Self-pay | Admitting: *Deleted

## 2021-10-12 MED ORDER — PEG 3350-KCL-NA BICARB-NACL 420 G PO SOLR: 4000.0000 mL | Freq: Once | ORAL | 0 refills | Status: AC

## 2021-11-07 ENCOUNTER — Inpatient Hospital Stay: Payer: Medicaid Other

## 2021-11-07 DIAGNOSIS — D508 Other iron deficiency anemias: Secondary | ICD-10-CM

## 2021-11-07 DIAGNOSIS — D509 Iron deficiency anemia, unspecified: Secondary | ICD-10-CM | POA: Diagnosis not present

## 2021-11-07 LAB — CBC WITH DIFFERENTIAL/PLATELET
Abs Immature Granulocytes: 0 10*3/uL (ref 0.00–0.07)
Basophils Absolute: 0.1 10*3/uL (ref 0.0–0.1)
Basophils Relative: 2 %
Eosinophils Absolute: 0.4 10*3/uL (ref 0.0–0.5)
Eosinophils Relative: 11 %
HCT: 39.1 % (ref 36.0–46.0)
Hemoglobin: 13.2 g/dL (ref 12.0–15.0)
Immature Granulocytes: 0 %
Lymphocytes Relative: 39 %
Lymphs Abs: 1.5 10*3/uL (ref 0.7–4.0)
MCH: 30.7 pg (ref 26.0–34.0)
MCHC: 33.8 g/dL (ref 30.0–36.0)
MCV: 90.9 fL (ref 80.0–100.0)
Monocytes Absolute: 0.3 10*3/uL (ref 0.1–1.0)
Monocytes Relative: 8 %
Neutro Abs: 1.6 10*3/uL — ABNORMAL LOW (ref 1.7–7.7)
Neutrophils Relative %: 40 %
Platelets: 214 10*3/uL (ref 150–400)
RBC: 4.3 MIL/uL (ref 3.87–5.11)
RDW: 15.1 % (ref 11.5–15.5)
WBC: 3.9 10*3/uL — ABNORMAL LOW (ref 4.0–10.5)
nRBC: 0 % (ref 0.0–0.2)

## 2021-11-07 LAB — IRON AND TIBC
Iron: 116 ug/dL (ref 28–170)
Saturation Ratios: 38 % — ABNORMAL HIGH (ref 10.4–31.8)
TIBC: 304 ug/dL (ref 250–450)
UIBC: 188 ug/dL

## 2021-11-07 LAB — FERRITIN: Ferritin: 47 ng/mL (ref 11–307)

## 2021-11-13 NOTE — Progress Notes (Unsigned)
[place], [place] [address]   CLINIC:  Medical Oncology/Hematology  PCP:  Sharilyn Sites, [place] [id number] [phone]   REASON FOR VISIT:  Follow-up for iron deficiency anemia  CURRENT THERAPY: Intermittent IV iron  INTERVAL HISTORY:  Ms. Gabriella Hodges 61 y.o. female returns for routine follow-up of her iron deficiency anemia.  She was last seen on 09/18/2021 by Dede Query, PA-C.  She received Venofer 200 mg x 3 doses from 09/25/2021 through 10/09/2021.  She reports that she felt no different after her IV iron infusions; continues to have chronic fatigue and "feel cold all the time."  She reports occasional rectal bleeding with bowel movement, usually preceded by burning pain in her lower abdomen.  She reports restless legs and lightheadedness.  No pica, headaches, chest pain, syncope.  Chronic dyspnea on exertion is at baseline.  She has little to no energy and 75% appetite. She endorses that she is maintaining a stable weight.   REVIEW OF SYSTEMS:  Review of Systems  Constitutional:  Positive for fatigue. Negative for appetite change, chills, diaphoresis, fever and unexpected weight change.  HENT:   Positive for trouble swallowing. Negative for lump/mass and nosebleeds.   Eyes:  Negative for eye problems.  Respiratory:  Positive for cough and shortness of breath. Negative for hemoptysis.   Cardiovascular:  Negative for chest pain, leg swelling and palpitations.  Gastrointestinal:  Positive for abdominal pain, blood in stool and nausea. Negative for constipation, diarrhea and vomiting.  Genitourinary:  Negative for hematuria.   Musculoskeletal:  Positive for arthralgias.  Skin: Negative.   Neurological:  Positive for dizziness. Negative for headaches and light-headedness.  Hematological:  Does not bruise/bleed easily.  Psychiatric/Behavioral:  Positive for sleep disturbance.       PAST MEDICAL/SURGICAL HISTORY:   Past Medical History:  Diagnosis Date   Anxiety    Epilepsy ([place])    Hypothyroidism    Thyroid disease    Past Surgical History:  Procedure Laterality Date   GASTRIC BYPASS     THYROID SURGERY     tumors   tumors     breast-benign     SOCIAL HISTORY:  Social History   Socioeconomic History   Marital status: Married    Spouse name: Not on file   Number of children: Not on file   Years of education: 11th grade   Highest education level: Not on file  Occupational History   Occupation: none  Tobacco Use   Smoking status: Every Day    Packs/day: 0.50    Years: 30.00    Total pack years: 15.00    Types: Cigarettes   Smokeless tobacco: Never  Vaping Use   Vaping Use: Never used  Substance and Sexual Activity   Alcohol use: No   Drug use: No   Sexual activity: Yes  Other Topics Concern   Not on file  Social History Narrative   Not on file   Social Determinants of Health   Financial Resource Strain: Not on file  Food Insecurity: Not on file  Transportation Needs: Not on file  Physical Activity: Not on file  Stress: Not on file  Social Connections: Not on file  Intimate Partner Violence: Not on file    FAMILY HISTORY:  Family History  Problem Relation Age of Onset   Thyroid cancer Mother    Breast cancer Sister    Heart disease Other    Lung disease Other  Cancer Other    Diabetes Other    Colon cancer Neg Hx     CURRENT MEDICATIONS:  Outpatient Encounter Medications as of 11/14/2021  Medication Sig   ALPRAZolam (XANAX) 0.5 MG tablet Take 0.5 mg by mouth 3 (three) times daily as needed. anxiety    Cholecalciferol (VITAMIN D3) 1.25 MG (50000 UT) CAPS Take by mouth.   escitalopram (LEXAPRO) 20 MG tablet Take 20 mg by mouth daily.   levothyroxine (SYNTHROID, LEVOTHROID) 175 MCG tablet Take 175 mcg by mouth daily.   omeprazole (PRILOSEC) 40 MG capsule Take 1 capsule (40 mg total) by mouth daily.   PHENobarbital (LUMINAL) 97.2 MG tablet Take 97.2 mg by  mouth at bedtime.     No facility-administered encounter medications on file as of 11/14/2021.    ALLERGIES:  Allergies  Allergen Reactions   Shellfish-Derived Products Anaphylaxis   Nsaids Hives   Penicillins Hives   Sulfa Antibiotics Hives     PHYSICAL EXAM: ECOG PERFORMANCE STATUS: 1 - Symptomatic but completely ambulatory  There were no vitals filed for this visit. There were no vitals filed for this visit. Physical Exam Constitutional:      Appearance: Normal appearance. She is obese.  HENT:     Head: Normocephalic and atraumatic.     Mouth/Throat:     Mouth: Mucous membranes are moist.  Eyes:     Extraocular Movements: Extraocular movements intact.     Pupils: Pupils are equal, round, and reactive to light.  Cardiovascular:     Rate and Rhythm: Normal rate and regular rhythm.     Pulses: Normal pulses.     Heart sounds: Normal heart sounds.  Pulmonary:     Effort: Pulmonary effort is normal.     Breath sounds: Normal breath sounds.  Abdominal:     General: Bowel sounds are normal.     Palpations: Abdomen is soft.     Tenderness: There is no abdominal tenderness.  Musculoskeletal:        General: No swelling.     Right lower leg: No edema.     Left lower leg: No edema.  Lymphadenopathy:     Cervical: No cervical adenopathy.  Skin:    General: Skin is warm and dry.  Neurological:     General: No focal deficit present.     Mental Status: She is alert and oriented to person, place, and time.  Psychiatric:        Mood and Affect: Mood normal.        Behavior: Behavior normal.      LABORATORY DATA:  I have reviewed the labs as listed.  CBC    Component Value Date/Time   WBC 3.9 (L) 11/07/2021 1104   RBC 4.30 11/07/2021 1104   HGB 13.2 11/07/2021 1104   HCT 39.1 11/07/2021 1104   PLT 214 11/07/2021 1104   MCV 90.9 11/07/2021 1104   MCH 30.7 11/07/2021 1104   MCHC 33.8 11/07/2021 1104   RDW 15.1 11/07/2021 1104   LYMPHSABS 1.5 11/07/2021 1104    MONOABS 0.3 11/07/2021 1104   EOSABS 0.4 11/07/2021 1104   BASOSABS 0.1 11/07/2021 1104      Latest Ref Rng & Units 09/18/2021    2:14 PM 12/19/2011    3:07 AM 12/19/2011    1:36 AM  CMP  Glucose 70 - 99 mg/dL 93   126   BUN 8 - 23 mg/dL 18   16   Creatinine 0.44 - 1.00 mg/dL 0.70  0.66   Sodium 135 - 145 mmol/L 139   138   Potassium 3.5 - 5.1 mmol/L 3.5   3.7   Chloride 98 - 111 mmol/L 111   104   CO2 22 - 32 mmol/L 21   26   Calcium 8.9 - 10.3 mg/dL 8.3   8.9   Total Protein 6.5 - 8.1 g/dL 6.4  6.4    Total Bilirubin 0.3 - 1.2 mg/dL 0.6  0.2    Alkaline Phos 38 - 126 U/L 82  88    AST 15 - 41 U/L 12  16    ALT 0 - 44 U/L 10  13      DIAGNOSTIC IMAGING:  I have independently reviewed the relevant imaging and discussed with the patient.  ASSESSMENT & PLAN: 1.  Iron deficiency anemia - Likely secondary to malabsorption due to gastric bypass surgery  - Upcoming EGD/colonoscopy with Dr. Abbey Chatters scheduled for 11/20/2021 - She was unable to tolerate oral iron tablets due to nausea and indigestion - She received Venofer 200 mg x 3 doses from 09/25/2021 through 10/09/2021 - She has occasional hematochezia associated with burning pain in her lower abdomen - Chronic fatigue and dyspnea on exertion are at baseline  - Most recent labs (11/07/2021): Hgb 13.2/MCV 90.9, ferritin 47, iron saturation 38% - PLAN: Recommend additional IV iron with Venofer 300 mg x 2 due to symptomatic iron deficiency (significant fatigue with ferritin <100)  - We will repeat labs (CBC/D, ferritin, iron/TIBC) in 3 months followed by phone visit. - Mild neutropenia noted, will continue to monitor at follow-up visits  2.  Vitamin B12 deficiency - She was previously taking monthly B12 injections and sublingual B12 replacement, but these were stopped after her labs from September 2023 showed significantly elevated vitamin B12 - Most recent B12 levels (09/18/2021): Elevated B12 4914, normal MMA 153 - PLAN: We we will  recheck B12/MMA at her follow-up visit in 3 months   PLAN SUMMARY & DISPOSITION: Venofer 300 mg x 2 Labs in 3 months (CBC/D, ferritin, iron/TIBC, B12, MMA) PHONE visit after labs   All questions were answered. The patient knows to call the clinic with any problems, questions or concerns.  Medical decision making: Low  Time spent on visit: I spent 15 minutes counseling the patient face to face. The total time spent in the appointment was 20 minutes and more than 50% was on counseling.   Harriett Rush, PA-C  11/14/2021 2:25 PM

## 2021-11-14 ENCOUNTER — Inpatient Hospital Stay: Payer: Medicaid Other | Attending: Hematology | Admitting: Physician Assistant

## 2021-11-14 VITALS — BP 152/68 | HR 63 | Temp 98.0°F | Resp 18 | Wt 163.0 lb

## 2021-11-14 DIAGNOSIS — Z803 Family history of malignant neoplasm of breast: Secondary | ICD-10-CM | POA: Diagnosis not present

## 2021-11-14 DIAGNOSIS — Z833 Family history of diabetes mellitus: Secondary | ICD-10-CM | POA: Insufficient documentation

## 2021-11-14 DIAGNOSIS — Z809 Family history of malignant neoplasm, unspecified: Secondary | ICD-10-CM | POA: Diagnosis not present

## 2021-11-14 DIAGNOSIS — D509 Iron deficiency anemia, unspecified: Secondary | ICD-10-CM | POA: Diagnosis not present

## 2021-11-14 DIAGNOSIS — Z79899 Other long term (current) drug therapy: Secondary | ICD-10-CM | POA: Diagnosis not present

## 2021-11-14 DIAGNOSIS — R0609 Other forms of dyspnea: Secondary | ICD-10-CM | POA: Insufficient documentation

## 2021-11-14 DIAGNOSIS — R5383 Other fatigue: Secondary | ICD-10-CM | POA: Insufficient documentation

## 2021-11-14 DIAGNOSIS — R059 Cough, unspecified: Secondary | ICD-10-CM | POA: Insufficient documentation

## 2021-11-14 DIAGNOSIS — R42 Dizziness and giddiness: Secondary | ICD-10-CM | POA: Diagnosis not present

## 2021-11-14 DIAGNOSIS — R1030 Lower abdominal pain, unspecified: Secondary | ICD-10-CM | POA: Insufficient documentation

## 2021-11-14 DIAGNOSIS — Z8249 Family history of ischemic heart disease and other diseases of the circulatory system: Secondary | ICD-10-CM | POA: Insufficient documentation

## 2021-11-14 DIAGNOSIS — R110 Nausea: Secondary | ICD-10-CM | POA: Insufficient documentation

## 2021-11-14 DIAGNOSIS — Z9884 Bariatric surgery status: Secondary | ICD-10-CM | POA: Diagnosis not present

## 2021-11-14 DIAGNOSIS — Z8349 Family history of other endocrine, nutritional and metabolic diseases: Secondary | ICD-10-CM | POA: Insufficient documentation

## 2021-11-14 DIAGNOSIS — E538 Deficiency of other specified B group vitamins: Secondary | ICD-10-CM | POA: Insufficient documentation

## 2021-11-14 DIAGNOSIS — G2581 Restless legs syndrome: Secondary | ICD-10-CM | POA: Insufficient documentation

## 2021-11-14 DIAGNOSIS — G479 Sleep disorder, unspecified: Secondary | ICD-10-CM | POA: Diagnosis not present

## 2021-11-14 DIAGNOSIS — F17210 Nicotine dependence, cigarettes, uncomplicated: Secondary | ICD-10-CM | POA: Insufficient documentation

## 2021-11-14 DIAGNOSIS — R0602 Shortness of breath: Secondary | ICD-10-CM | POA: Diagnosis not present

## 2021-11-14 DIAGNOSIS — Z836 Family history of other diseases of the respiratory system: Secondary | ICD-10-CM | POA: Insufficient documentation

## 2021-11-14 DIAGNOSIS — Z882 Allergy status to sulfonamides status: Secondary | ICD-10-CM | POA: Diagnosis not present

## 2021-11-14 DIAGNOSIS — M2550 Pain in unspecified joint: Secondary | ICD-10-CM | POA: Insufficient documentation

## 2021-11-14 DIAGNOSIS — K921 Melena: Secondary | ICD-10-CM | POA: Insufficient documentation

## 2021-11-14 NOTE — Patient Instructions (Signed)
[facility] [place] **   You were seen today by Tarri Abernethy PA-C for your iron deficiency.    IRON DEFICIENCY Your iron levels improved after your most recent IV iron. However, since you are still fatigued and your ferritin levels are less than 100, we will try some additional doses of IV iron to see if that improves your energy levels. Continue to follow-up with the GI doctors for EGD/colonoscopy to see if you are having any intestinal bleeding. We believe the main reason for your low iron levels is due to decreased absorption of iron following your gastric bypass surgery.  VITAMIN B-12 DEFICIENCY Your most recent vitamin B12 levels (September 2023) were very high. You have stopped taking all vitamin B12 supplements for the time being. We will check your vitamin B12 labs before your next visit to see if you need to restart any medications at that time.  MEDICATIONS: IV iron x2 doses  FOLLOW-UP APPOINTMENT: Labs in 3 months with phone visit 1 week after  ** Thank you for trusting me with your healthcare!  I strive to provide all of my patients with quality care at each visit.  If you receive a survey for this visit, I would be so grateful to you for taking the time to provide feedback.  Thank you in advance!  ~ Bonifacio Pruden                   Dr. Derek Jack   &   Tarri Abernethy, PA-C   - - - - - - - - - - - - - - - - - -    Thank you for choosing [facility] at [facility] to provide your oncology and hematology care.  To afford each patient quality time with our provider, please arrive at least 15 minutes before your scheduled appointment time.   If you have a lab appointment with the [facility] please come in thru the Main Entrance and check in at the main information desk.  You need to re-schedule your appointment should you arrive 10 or more minutes late.  We strive to give  you quality time with our providers, and arriving late affects you and other patients whose appointments are after yours.  Also, if you no show three or more times for appointments you may be dismissed from the clinic at the providers discretion.     Again, thank you for choosing [facility].  Our hope is that these requests will decrease the amount of time that you wait before being seen by our physicians.       _____________________________________________________________  Should you have questions after your visit to [facility], please contact our office at [phone] and follow the prompts.  Our office hours are 8:00 a.m. and 4:30 p.m. Monday - Friday.  Please note that voicemails left after 4:00 p.m. may not be returned until the following business day.  We are closed weekends and major holidays.  You do have access to a nurse 24-7, just call the main number to the clinic [phone] and do not press any options, hold on the line and a nurse will answer the phone.    For prescription refill requests, have your pharmacy contact our office and allow 72 hours.

## 2021-11-15 ENCOUNTER — Encounter (HOSPITAL_COMMUNITY): Payer: Self-pay

## 2021-11-15 ENCOUNTER — Encounter (HOSPITAL_COMMUNITY)
Admission: RE | Admit: 2021-11-15 | Discharge: 2021-11-15 | Disposition: A | Discharge status: Home / Self Care | Payer: Medicaid Other | Source: Ambulatory Visit | Attending: Internal Medicine | Admitting: Internal Medicine

## 2021-11-15 HISTORY — DX: Gastro-esophageal reflux disease without esophagitis: K21.9

## 2021-11-20 ENCOUNTER — Encounter (HOSPITAL_COMMUNITY): Admission: RE | Payer: Self-pay | Source: Ambulatory Visit

## 2021-11-20 ENCOUNTER — Ambulatory Visit (HOSPITAL_COMMUNITY): Admission: RE | Admit: 2021-11-20 | Payer: Medicaid Other | Source: Ambulatory Visit

## 2021-11-20 SURGERY — COLONOSCOPY WITH PROPOFOL
Anesthesia: Monitor Anesthesia Care

## 2021-11-21 ENCOUNTER — Inpatient Hospital Stay: Payer: Medicaid Other

## 2021-11-21 ENCOUNTER — Other Ambulatory Visit: Payer: Self-pay | Admitting: Physician Assistant

## 2021-11-21 VITALS — BP 122/63 | HR 49 | Temp 97.0°F | Resp 18

## 2021-11-21 DIAGNOSIS — D509 Iron deficiency anemia, unspecified: Secondary | ICD-10-CM | POA: Diagnosis not present

## 2021-11-21 DIAGNOSIS — R110 Nausea: Secondary | ICD-10-CM

## 2021-11-21 MED ORDER — LORATADINE 10 MG PO TABS
10.0000 mg | ORAL_TABLET | Freq: Once | ORAL | Status: AC
  Administered 2021-11-21: 10 mg via ORAL
  Filled 2021-11-21: qty 1

## 2021-11-21 MED ORDER — ACETAMINOPHEN 325 MG PO TABS
650.0000 mg | ORAL_TABLET | Freq: Once | ORAL | Status: AC
  Administered 2021-11-21: 650 mg via ORAL
  Filled 2021-11-21: qty 2

## 2021-11-21 MED ORDER — ONDANSETRON HCL 4 MG/2ML IJ SOLN
4.0000 mg | Freq: Once | INTRAMUSCULAR | Status: AC
  Administered 2021-11-21: 4 mg via INTRAVENOUS
  Filled 2021-11-21: qty 2

## 2021-11-21 MED ORDER — SODIUM CHLORIDE 0.9 % IV SOLN
300.0000 mg | Freq: Once | INTRAVENOUS | Status: AC
  Administered 2021-11-21: 300 mg via INTRAVENOUS
  Filled 2021-11-21: qty 300

## 2021-11-21 MED ORDER — SODIUM CHLORIDE 0.9 % IV SOLN: Freq: Once | INTRAVENOUS | Status: AC

## 2021-11-21 MED ORDER — ONDANSETRON 4 MG PO TBDP: 4.0000 mg | ORAL_TABLET | Freq: Three times a day (TID) | ORAL | 0 refills | Status: DC | PRN

## 2021-11-21 NOTE — Progress Notes (Signed)
Patient presents today for venofer, patient reports having nausea and sore throat after the last infusion, Rojelio Brenner, PA made aware, IV zofran added to pre-meds. Patient tolerated iron infusion with no complaints voiced. Peripheral IV site clean and dry with good blood return noted before and after infusion. Band aid applied. VSS with discharge and left in satisfactory condition with no s/s of distress noted.

## 2021-11-21 NOTE — Progress Notes (Signed)
Zofran sent to pharmacy and added to premeds due to nausea after IV iron infusion.

## 2021-11-21 NOTE — Patient Instructions (Signed)
[facility] [place]  Discharge Instructions: Thank you for choosing [facility] to provide your oncology and hematology care.  If you have a lab appointment with the Cancer Center, please come in thru the Main Entrance and check in at the main information desk.  Wear comfortable clothing and clothing appropriate for easy access to any Portacath or PICC line.   We strive to give you quality time with your provider. You may need to reschedule your appointment if you arrive late (15 or more minutes).  Arriving late affects you and other patients whose appointments are after yours.  Also, if you miss three or more appointments without notifying the office, you may be dismissed from the clinic at the provider's discretion.      For prescription refill requests, have your pharmacy contact our office and allow 72 hours for refills to be completed.    Today you received the following Venofer, return as scheduled.   To help prevent nausea and vomiting after your treatment, we encourage you to take your nausea medication as directed.  BELOW ARE SYMPTOMS THAT SHOULD BE REPORTED IMMEDIATELY: *FEVER GREATER THAN 100.4 F (38 C) OR HIGHER *CHILLS OR SWEATING *NAUSEA AND VOMITING THAT IS NOT CONTROLLED WITH YOUR NAUSEA MEDICATION *UNUSUAL SHORTNESS OF BREATH *UNUSUAL BRUISING OR BLEEDING *URINARY PROBLEMS (pain or burning when urinating, or frequent urination) *BOWEL PROBLEMS (unusual diarrhea, constipation, pain near the anus) TENDERNESS IN MOUTH AND THROAT WITH OR WITHOUT PRESENCE OF ULCERS (sore throat, sores in mouth, or a toothache) UNUSUAL RASH, SWELLING OR PAIN  UNUSUAL VAGINAL DISCHARGE OR ITCHING   Items with * indicate a potential emergency and should be followed up as soon as possible or go to the Emergency Department if any problems should occur.  Please show the CHEMOTHERAPY ALERT CARD or IMMUNOTHERAPY ALERT CARD at check-in to the Emergency Department and triage  nurse.  Should you have questions after your visit or need to cancel or reschedule your appointment, please contact [facility] AT  [phone]  and follow the prompts.  Office hours are 8:00 a.m. to 4:30 p.m. Monday - Friday. Please note that voicemails left after 4:00 p.m. may not be returned until the following business day.  We are closed weekends and major holidays. You have access to a nurse at all times for urgent questions. Please call the main number to the clinic [phone] and follow the prompts.  For any non-urgent questions, you may also contact your provider using MyChart. We now offer e-Visits for anyone 18 and older to request care online for non-urgent symptoms. For details visit [email or web address].   Also download the MyChart app! Go to the app store, search "MyChart", open the app, select [place], and log in with your MyChart username and password.  Masks are optional in the cancer centers. If you would like for your care team to wear a mask while they are taking care of you, please let them know. You may have one support person who is at least 62 years old accompany you for your appointments.  

## 2021-11-21 NOTE — Progress Notes (Signed)
Patient previously received premedication of Tylenol 650 mg po x 1 and claritin 10 mg po x 1 with each Venofer infusion.  Added to current supportive plan to be given prior to Venofer infusions:  Tylenol 650 mg po x 1 Claritin 10 mg po x 1  Adding zofran 4 mg IVPush x 1 with premedication due to nausea with Venofer infusion.   T.ORojelio Brenner, PA-C/Anora Schwenke Yetta Barre, PharmD

## 2021-11-28 ENCOUNTER — Inpatient Hospital Stay: Payer: Medicaid Other

## 2021-11-28 ENCOUNTER — Encounter: Payer: Self-pay | Admitting: *Deleted

## 2021-12-26 ENCOUNTER — Inpatient Hospital Stay: Payer: Medicaid Other | Attending: Hematology

## 2021-12-26 VITALS — BP 123/67 | HR 50 | Temp 96.8°F | Resp 18

## 2021-12-26 DIAGNOSIS — Z808 Family history of malignant neoplasm of other organs or systems: Secondary | ICD-10-CM | POA: Insufficient documentation

## 2021-12-26 DIAGNOSIS — R5383 Other fatigue: Secondary | ICD-10-CM | POA: Diagnosis not present

## 2021-12-26 DIAGNOSIS — Z8249 Family history of ischemic heart disease and other diseases of the circulatory system: Secondary | ICD-10-CM | POA: Insufficient documentation

## 2021-12-26 DIAGNOSIS — Z825 Family history of asthma and other chronic lower respiratory diseases: Secondary | ICD-10-CM | POA: Insufficient documentation

## 2021-12-26 DIAGNOSIS — Z803 Family history of malignant neoplasm of breast: Secondary | ICD-10-CM | POA: Insufficient documentation

## 2021-12-26 DIAGNOSIS — Z79899 Other long term (current) drug therapy: Secondary | ICD-10-CM | POA: Diagnosis not present

## 2021-12-26 DIAGNOSIS — R0602 Shortness of breath: Secondary | ICD-10-CM | POA: Diagnosis not present

## 2021-12-26 DIAGNOSIS — D509 Iron deficiency anemia, unspecified: Secondary | ICD-10-CM | POA: Insufficient documentation

## 2021-12-26 DIAGNOSIS — Z833 Family history of diabetes mellitus: Secondary | ICD-10-CM | POA: Diagnosis not present

## 2021-12-26 DIAGNOSIS — F17210 Nicotine dependence, cigarettes, uncomplicated: Secondary | ICD-10-CM | POA: Insufficient documentation

## 2021-12-26 DIAGNOSIS — R059 Cough, unspecified: Secondary | ICD-10-CM | POA: Diagnosis not present

## 2021-12-26 MED ORDER — ACETAMINOPHEN 325 MG PO TABS
650.0000 mg | ORAL_TABLET | Freq: Once | ORAL | Status: AC
  Administered 2021-12-26: 650 mg via ORAL
  Filled 2021-12-26: qty 2

## 2021-12-26 MED ORDER — SODIUM CHLORIDE 0.9 % IV SOLN
300.0000 mg | Freq: Once | INTRAVENOUS | Status: AC
  Administered 2021-12-26: 300 mg via INTRAVENOUS
  Filled 2021-12-26: qty 5

## 2021-12-26 MED ORDER — ONDANSETRON HCL 4 MG/2ML IJ SOLN
4.0000 mg | Freq: Once | INTRAMUSCULAR | Status: AC
  Administered 2021-12-26: 4 mg via INTRAVENOUS
  Filled 2021-12-26: qty 2

## 2021-12-26 MED ORDER — LORATADINE 10 MG PO TABS
10.0000 mg | ORAL_TABLET | Freq: Once | ORAL | Status: AC
  Administered 2021-12-26: 10 mg via ORAL
  Filled 2021-12-26: qty 1

## 2021-12-26 MED ORDER — SODIUM CHLORIDE 0.9 % IV SOLN: Freq: Once | INTRAVENOUS | Status: AC

## 2021-12-26 NOTE — Patient Instructions (Signed)
[facility] [place]  Discharge Instructions: Thank you for choosing [facility] to provide your oncology and hematology care.  If you have a lab appointment with the Cancer Center, please come in thru the Main Entrance and check in at the main information desk.  Wear comfortable clothing and clothing appropriate for easy access to any Portacath or PICC line.   We strive to give you quality time with your provider. You may need to reschedule your appointment if you arrive late (15 or more minutes).  Arriving late affects you and other patients whose appointments are after yours.  Also, if you miss three or more appointments without notifying the office, you may be dismissed from the clinic at the provider's discretion.      For prescription refill requests, have your pharmacy contact our office and allow 72 hours for refills to be completed.    Today you received the following chemotherapy and/or immunotherapy agents Venofer 300 mg. Iron Sucrose Injection What is this medication? IRON SUCROSE (EYE ern SOO krose) treats low levels of iron (iron deficiency anemia) in people with kidney disease. Iron is a mineral that plays an important role in making red blood cells, which carry oxygen from your lungs to the rest of your body. This medicine may be used for other purposes; ask your health care provider or pharmacist if you have questions. COMMON BRAND NAME(S): Venofer What should I tell my care team before I take this medication? They need to know if you have any of these conditions: Anemia not caused by low iron levels Heart disease High levels of iron in the blood Kidney disease Liver disease An unusual or allergic reaction to iron, other medications, foods, dyes, or preservatives Pregnant or trying to get pregnant Breastfeeding How should I use this medication? This medication is for infusion into a vein. It is given in a hospital or clinic setting. Talk to your  care team about the use of this medication in children. While this medication may be prescribed for children as young as 2 years for selected conditions, precautions do apply. Overdosage: If you think you have taken too much of this medicine contact a poison control center or emergency room at once. NOTE: This medicine is only for you. Do not share this medicine with others. What if I miss a dose? Keep appointments for follow-up doses. It is important not to miss your dose. Call your care team if you are unable to keep an appointment. What may interact with this medication? Do not take this medication with any of the following: Deferoxamine Dimercaprol Other iron products This medication may also interact with the following: Chloramphenicol Deferasirox This list may not describe all possible interactions. Give your health care provider a list of all the medicines, herbs, non-prescription drugs, or dietary supplements you use. Also tell them if you smoke, drink alcohol, or use illegal drugs. Some items may interact with your medicine. What should I watch for while using this medication? Visit your care team regularly. Tell your care team if your symptoms do not start to get better or if they get worse. You may need blood work done while you are taking this medication. You may need to follow a special diet. Talk to your care team. Foods that contain iron include: whole grains/cereals, dried fruits, beans, or peas, leafy green vegetables, and organ meats (liver, kidney). What side effects may I notice from receiving this medication? Side effects that you should report to   your care team as soon as possible: Allergic reactions--skin rash, itching, hives, swelling of the face, lips, tongue, or throat Low blood pressure--dizziness, feeling faint or lightheaded, blurry vision Shortness of breath Side effects that usually do not require medical attention (report to your care team if they continue or are  bothersome): Flushing Headache Joint pain Muscle pain Nausea Pain, redness, or irritation at injection site This list may not describe all possible side effects. Call your doctor for medical advice about side effects. You may report side effects to FDA at 1-800-FDA-1088. Where should I keep my medication? This medication is given in a hospital or clinic and will not be stored at home. NOTE: This sheet is a summary. It may not cover all possible information. If you have questions about this medicine, talk to your doctor, pharmacist, or health care provider.  2023 Elsevier/Gold Standard (2020-04-07 00:00:00)       To help prevent nausea and vomiting after your treatment, we encourage you to take your nausea medication as directed.  BELOW ARE SYMPTOMS THAT SHOULD BE REPORTED IMMEDIATELY: *FEVER GREATER THAN 100.4 F (38 C) OR HIGHER *CHILLS OR SWEATING *NAUSEA AND VOMITING THAT IS NOT CONTROLLED WITH YOUR NAUSEA MEDICATION *UNUSUAL SHORTNESS OF BREATH *UNUSUAL BRUISING OR BLEEDING *URINARY PROBLEMS (pain or burning when urinating, or frequent urination) *BOWEL PROBLEMS (unusual diarrhea, constipation, pain near the anus) TENDERNESS IN MOUTH AND THROAT WITH OR WITHOUT PRESENCE OF ULCERS (sore throat, sores in mouth, or a toothache) UNUSUAL RASH, SWELLING OR PAIN  UNUSUAL VAGINAL DISCHARGE OR ITCHING   Items with * indicate a potential emergency and should be followed up as soon as possible or go to the Emergency Department if any problems should occur.  Please show the CHEMOTHERAPY ALERT CARD or IMMUNOTHERAPY ALERT CARD at check-in to the Emergency Department and triage nurse.  Should you have questions after your visit or need to cancel or reschedule your appointment, please contact [facility] [place] [phone]  and follow the prompts.  Office hours are 8:00 a.m. to 4:30 p.m. Monday - Friday. Please note that voicemails left after 4:00 p.m. may not be returned until  the following business day.  We are closed weekends and major holidays. You have access to a nurse at all times for urgent questions. Please call the main number to the clinic [phone] and follow the prompts.  For any non-urgent questions, you may also contact your provider using MyChart. We now offer e-Visits for anyone 18 and older to request care online for non-urgent symptoms. For details visit [email or web address].   Also download the MyChart app! Go to the app store, search "MyChart", open the app, select [place], and log in with your MyChart username and password.  Masks are optional in the cancer centers. If you would like for your care team to wear a mask while they are taking care of you, please let them know. You may have one support person who is at least 62 years old accompany you for your appointments.  

## 2021-12-26 NOTE — Progress Notes (Signed)
Patient presents today for Venofer 300 mg infusion. Vital signs are stable. Patient has no complaints of any side effects related to her last iron infusion. MAR reviewed and updated.   Venofer given today per MD orders. Tolerated infusion without adverse affects. Vital signs stable. No complaints at this time. Discharged from clinic ambulatory in stable condition. Alert and oriented x 3. F/U with [facility] as scheduled.

## 2021-12-27 ENCOUNTER — Telehealth: Payer: Self-pay | Admitting: *Deleted

## 2021-12-27 ENCOUNTER — Telehealth: Payer: Self-pay | Admitting: Internal Medicine

## 2021-12-27 NOTE — Telephone Encounter (Signed)
The patient left a message saying that "a Tammy called" her.  She was returning the call.  [phone]

## 2021-12-27 NOTE — Telephone Encounter (Signed)
[facility] to reschedule colonoscopy  [place] ASA 2 IDA,rectal bleeding

## 2021-12-28 ENCOUNTER — Encounter: Payer: Self-pay | Admitting: *Deleted

## 2021-12-28 MED ORDER — PEG 3350-KCL-NA BICARB-NACL 420 G PO SOLR: 4000.0000 mL | Freq: Once | ORAL | 0 refills | Status: AC

## 2021-12-28 NOTE — Telephone Encounter (Signed)
Pt has been rescheduled for 01/22/22 at 9:15 am. Instructions mailed to pt and prep sent to the pharamcy

## 2021-12-28 NOTE — Telephone Encounter (Signed)
See previous TE

## 2021-12-28 NOTE — Telephone Encounter (Signed)
[facility]

## 2022-01-18 ENCOUNTER — Encounter (HOSPITAL_COMMUNITY): Admission: RE | Admit: 2022-01-18 | Payer: Medicaid Other | Source: Ambulatory Visit

## 2022-01-22 ENCOUNTER — Encounter (HOSPITAL_COMMUNITY): Admission: RE | Payer: Self-pay | Source: Ambulatory Visit

## 2022-01-22 ENCOUNTER — Ambulatory Visit (HOSPITAL_COMMUNITY): Admission: RE | Admit: 2022-01-22 | Payer: Medicaid Other | Source: Ambulatory Visit

## 2022-01-22 SURGERY — COLONOSCOPY WITH PROPOFOL
Anesthesia: Monitor Anesthesia Care

## 2022-02-16 ENCOUNTER — Inpatient Hospital Stay: Payer: Medicaid Other | Attending: Hematology

## 2022-02-16 DIAGNOSIS — Z79899 Other long term (current) drug therapy: Secondary | ICD-10-CM | POA: Insufficient documentation

## 2022-02-16 DIAGNOSIS — K909 Intestinal malabsorption, unspecified: Secondary | ICD-10-CM | POA: Insufficient documentation

## 2022-02-16 DIAGNOSIS — R0609 Other forms of dyspnea: Secondary | ICD-10-CM | POA: Insufficient documentation

## 2022-02-16 DIAGNOSIS — R1030 Lower abdominal pain, unspecified: Secondary | ICD-10-CM | POA: Diagnosis not present

## 2022-02-16 DIAGNOSIS — E538 Deficiency of other specified B group vitamins: Secondary | ICD-10-CM

## 2022-02-16 DIAGNOSIS — R110 Nausea: Secondary | ICD-10-CM | POA: Diagnosis not present

## 2022-02-16 DIAGNOSIS — D509 Iron deficiency anemia, unspecified: Secondary | ICD-10-CM | POA: Insufficient documentation

## 2022-02-16 DIAGNOSIS — R5383 Other fatigue: Secondary | ICD-10-CM | POA: Diagnosis not present

## 2022-02-16 DIAGNOSIS — R42 Dizziness and giddiness: Secondary | ICD-10-CM | POA: Diagnosis not present

## 2022-02-16 DIAGNOSIS — R519 Headache, unspecified: Secondary | ICD-10-CM | POA: Diagnosis not present

## 2022-02-16 DIAGNOSIS — R002 Palpitations: Secondary | ICD-10-CM | POA: Insufficient documentation

## 2022-02-16 DIAGNOSIS — Z9884 Bariatric surgery status: Secondary | ICD-10-CM | POA: Diagnosis not present

## 2022-02-16 DIAGNOSIS — R059 Cough, unspecified: Secondary | ICD-10-CM | POA: Diagnosis not present

## 2022-02-16 DIAGNOSIS — R1013 Epigastric pain: Secondary | ICD-10-CM | POA: Insufficient documentation

## 2022-02-16 DIAGNOSIS — R0602 Shortness of breath: Secondary | ICD-10-CM | POA: Diagnosis not present

## 2022-02-16 LAB — CBC WITH DIFFERENTIAL/PLATELET
Abs Immature Granulocytes: 0 10*3/uL (ref 0.00–0.07)
Basophils Absolute: 0.1 10*3/uL (ref 0.0–0.1)
Basophils Relative: 2 %
Eosinophils Absolute: 0.4 10*3/uL (ref 0.0–0.5)
Eosinophils Relative: 8 %
HCT: 42.1 % (ref 36.0–46.0)
Hemoglobin: 13.6 g/dL (ref 12.0–15.0)
Immature Granulocytes: 0 %
Lymphocytes Relative: 34 %
Lymphs Abs: 1.6 10*3/uL (ref 0.7–4.0)
MCH: 31.2 pg (ref 26.0–34.0)
MCHC: 32.3 g/dL (ref 30.0–36.0)
MCV: 96.6 fL (ref 80.0–100.0)
Monocytes Absolute: 0.4 10*3/uL (ref 0.1–1.0)
Monocytes Relative: 8 %
Neutro Abs: 2.3 10*3/uL (ref 1.7–7.7)
Neutrophils Relative %: 48 %
Platelets: 231 10*3/uL (ref 150–400)
RBC: 4.36 MIL/uL (ref 3.87–5.11)
RDW: 14.3 % (ref 11.5–15.5)
WBC: 4.6 10*3/uL (ref 4.0–10.5)
nRBC: 0 % (ref 0.0–0.2)

## 2022-02-16 LAB — FERRITIN: Ferritin: 99 ng/mL (ref 11–307)

## 2022-02-16 LAB — IRON AND TIBC
Iron: 52 ug/dL (ref 28–170)
Saturation Ratios: 15 % (ref 10.4–31.8)
TIBC: 339 ug/dL (ref 250–450)
UIBC: 287 ug/dL

## 2022-02-16 LAB — VITAMIN B12: Vitamin B-12: 388 pg/mL (ref 180–914)

## 2022-02-20 LAB — METHYLMALONIC ACID, SERUM: Methylmalonic Acid, Quantitative: 211 nmol/L (ref 0–378)

## 2022-02-23 ENCOUNTER — Inpatient Hospital Stay (HOSPITAL_BASED_OUTPATIENT_CLINIC_OR_DEPARTMENT_OTHER): Payer: Medicaid Other | Admitting: Physician Assistant

## 2022-02-23 DIAGNOSIS — E538 Deficiency of other specified B group vitamins: Secondary | ICD-10-CM

## 2022-02-23 DIAGNOSIS — D508 Other iron deficiency anemias: Secondary | ICD-10-CM

## 2022-02-23 NOTE — Progress Notes (Signed)
VIRTUAL VISIT via [place]   I connected with Gabriella Hodges  on 02/23/22 at  1:50 PM by telephone and verified that I am speaking with the correct person using two identifiers.  Location: Patient: Home Provider: [facility]   I discussed the limitations, risks, security and privacy concerns of performing an evaluation and management service by telephone and the availability of in person appointments. I also discussed with the patient that there may be a patient responsible charge related to this service. The patient expressed understanding and agreed to proceed.  REASON FOR VISIT:  Follow-up for iron deficiency anemia   CURRENT THERAPY: Intermittent IV iron  INTERVAL HISTORY:  Gabriella Hodges is contacted today for follow-up of her iron deficiency anemia.  She was last seen on 11/14/2021 by Tarri Abernethy PA-C.  She received Venofer 300 mg x 2 doses on 11/21/2021 and 12/26/2021, given with IV Zofran to ameliorate her nausea.  She reports that she felt no different after her IV iron infusions; continues to have chronic fatigue and "feel cold all the time."  She reports occasional (approximately once a month) rectal bleeding with bowel movement, usually preceded by burning pain in her lower abdomen. She reports lightheadedness and chronic headaches. No pica, chest pain, syncope.  Chronic dyspnea on exertion is at baseline.  She has 40% energy and 70% appetite. She endorses that she is maintaining a stable weight.  REVIEW OF SYSTEMS:   Review of Systems  Constitutional:  Positive for malaise/fatigue. Negative for chills, diaphoresis, fever and weight loss.  Respiratory:  Positive for cough and shortness of breath (with exertion).   Cardiovascular:  Positive for palpitations. Negative for chest pain.  Gastrointestinal:  Negative for abdominal pain, blood in stool, melena, nausea and vomiting.  Neurological:  Positive for headaches.  Negative for dizziness.    PHYSICAL EXAM: (per limitations of virtual telephone visit)  The patient is alert and oriented x 3, exhibiting adequate mentation, good mood, and ability to speak in full sentences and execute sound judgement.  ASSESSMENT & PLAN:  1.  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 Abbey Chatters in November 2023, but this was canceled due to transportation issues - She was unable to tolerate oral iron tablets due to nausea and indigestion - Most recent Venofer in December 2023 (tolerates better with IV ZOFRAN) - She has occasional hematochezia associated with burning pain in her lower abdomen - Chronic fatigue and dyspnea on exertion are at baseline - Most recent labs (02/16/2022): Hgb 13.6/MCV 96.6, ferritin 99, iron saturation 15 % - PLAN: No IV iron at this time - We will repeat labs (CBC/D, ferritin, iron/TIBC) in 3 months followed by phone visit. - Patient advised to call [facility] to reschedule her EGD/colonoscopy   2.  Vitamin B12 deficiency - She was previously taking monthly B12 injections and sublingual B12 replacement, but these were stopped after her labs from September 2023 showed significantly elevated vitamin B12 - Most recent B12 levels (02/16/2022): Vitamin B12 388, MMA 211 - PLAN: B12 levels have trended sharply downward since she stops all vitamin B12 repletion.  Recommend restarting sublingual B12 tablets  - We we will recheck B12/MMA at her follow-up visit in 3 months  PLAN SUMMARY: >> Labs in 3 months (CBC/D, ferritin, iron/TIBC, B12, MMA) >> PHONE visit 1 week after labs  I discussed the assessment and treatment plan with the patient. The patient was provided an opportunity to ask questions and all were answered. The patient agreed with the plan and demonstrated an understanding of the instructions.   The patient was advised  to call back or seek an in-person evaluation if the symptoms worsen or if the condition fails to improve as anticipated.  I provided 22 minutes of non-face-to-face time during this encounter.   Harriett Rush, PA-C 02/23/22 2:16 PM

## 2022-02-26 ENCOUNTER — Other Ambulatory Visit: Payer: Self-pay

## 2022-02-26 DIAGNOSIS — E538 Deficiency of other specified B group vitamins: Secondary | ICD-10-CM

## 2022-02-26 DIAGNOSIS — D508 Other iron deficiency anemias: Secondary | ICD-10-CM

## 2022-02-26 DIAGNOSIS — D509 Iron deficiency anemia, unspecified: Secondary | ICD-10-CM

## 2022-05-24 ENCOUNTER — Inpatient Hospital Stay: Payer: Medicaid Other | Attending: Hematology

## 2022-05-24 DIAGNOSIS — R0609 Other forms of dyspnea: Secondary | ICD-10-CM | POA: Diagnosis not present

## 2022-05-24 DIAGNOSIS — R002 Palpitations: Secondary | ICD-10-CM | POA: Insufficient documentation

## 2022-05-24 DIAGNOSIS — Z79899 Other long term (current) drug therapy: Secondary | ICD-10-CM | POA: Insufficient documentation

## 2022-05-24 DIAGNOSIS — D509 Iron deficiency anemia, unspecified: Secondary | ICD-10-CM

## 2022-05-24 DIAGNOSIS — R059 Cough, unspecified: Secondary | ICD-10-CM | POA: Insufficient documentation

## 2022-05-24 DIAGNOSIS — K921 Melena: Secondary | ICD-10-CM | POA: Insufficient documentation

## 2022-05-24 DIAGNOSIS — R1013 Epigastric pain: Secondary | ICD-10-CM | POA: Diagnosis not present

## 2022-05-24 DIAGNOSIS — E538 Deficiency of other specified B group vitamins: Secondary | ICD-10-CM | POA: Diagnosis not present

## 2022-05-24 DIAGNOSIS — D508 Other iron deficiency anemias: Secondary | ICD-10-CM

## 2022-05-24 DIAGNOSIS — R0602 Shortness of breath: Secondary | ICD-10-CM | POA: Insufficient documentation

## 2022-05-24 DIAGNOSIS — R519 Headache, unspecified: Secondary | ICD-10-CM | POA: Diagnosis not present

## 2022-05-24 DIAGNOSIS — R110 Nausea: Secondary | ICD-10-CM | POA: Diagnosis not present

## 2022-05-24 DIAGNOSIS — Z9884 Bariatric surgery status: Secondary | ICD-10-CM | POA: Insufficient documentation

## 2022-05-24 DIAGNOSIS — R42 Dizziness and giddiness: Secondary | ICD-10-CM | POA: Diagnosis not present

## 2022-05-24 LAB — CBC WITH DIFFERENTIAL/PLATELET
Abs Immature Granulocytes: 0.01 10*3/uL (ref 0.00–0.07)
Basophils Absolute: 0.1 10*3/uL (ref 0.0–0.1)
Basophils Relative: 1 %
Eosinophils Absolute: 0.3 10*3/uL (ref 0.0–0.5)
Eosinophils Relative: 6 %
HCT: 42.4 % (ref 36.0–46.0)
Hemoglobin: 13.9 g/dL (ref 12.0–15.0)
Immature Granulocytes: 0 %
Lymphocytes Relative: 28 %
Lymphs Abs: 1.6 10*3/uL (ref 0.7–4.0)
MCH: 31.7 pg (ref 26.0–34.0)
MCHC: 32.8 g/dL (ref 30.0–36.0)
MCV: 96.6 fL (ref 80.0–100.0)
Monocytes Absolute: 0.5 10*3/uL (ref 0.1–1.0)
Monocytes Relative: 8 %
Neutro Abs: 3.3 10*3/uL (ref 1.7–7.7)
Neutrophils Relative %: 57 %
Platelets: 268 10*3/uL (ref 150–400)
RBC: 4.39 MIL/uL (ref 3.87–5.11)
RDW: 13.9 % (ref 11.5–15.5)
WBC: 5.7 10*3/uL (ref 4.0–10.5)
nRBC: 0 % (ref 0.0–0.2)

## 2022-05-24 LAB — FERRITIN: Ferritin: 98 ng/mL (ref 11–307)

## 2022-05-24 LAB — IRON AND TIBC
Iron: 99 ug/dL (ref 28–170)
Saturation Ratios: 31 % (ref 10.4–31.8)
TIBC: 322 ug/dL (ref 250–450)
UIBC: 223 ug/dL

## 2022-05-24 LAB — VITAMIN B12: Vitamin B-12: >7500 pg/mL — ABNORMAL HIGH (ref 180–914)

## 2022-05-25 ENCOUNTER — Inpatient Hospital Stay: Payer: Medicaid Other

## 2022-05-28 LAB — METHYLMALONIC ACID, SERUM: Methylmalonic Acid, Quantitative: 174 nmol/L (ref 0–378)

## 2022-05-31 NOTE — Progress Notes (Signed)
VIRTUAL VISIT via TELEPHONE NOTE [place]   I connected with Gabriella Hodges  on 06/01/22 at 11:50 AM by telephone and verified that I am speaking with the correct person using two identifiers.  Location: Patient: Home Provider: [facility]   I discussed the limitations, risks, security and privacy concerns of performing an evaluation and management service by telephone and the availability of in person appointments. I also discussed with the patient that there may be a patient responsible charge related to this service. The patient expressed understanding and agreed to proceed.  REASON FOR VISIT:  Follow-up for iron deficiency anemia   CURRENT THERAPY: Intermittent IV iron  INTERVAL HISTORY:  Gabriella Hodges is contacted today for follow-up of iron deficiency anemia.  She was last evaluated via telemedicine visit on 02/23/2022 by Rojelio Brenner PA-C.  Her last IV iron was with Venofer 300 mg x 2 doses on 11/21/2021 and 12/26/2021, given with IV Zofran to ameliorate her nausea.   She reports that she felt no different after her IV iron infusions in December; continues to have chronic fatigue and "feel cold all the time."  She has not noticed any rectal bleeding for the past several months.  She reports lightheadedness and chronic headaches.   No pica, chest pain, syncope.  Chronic dyspnea on exertion is at baseline.  She has 40% energy and 70% appetite. She endorses that she is maintaining a stable weight.  REVIEW OF SYSTEMS:   Review of Systems  Constitutional:  Negative for chills, diaphoresis, fever, malaise/fatigue and weight loss.  Respiratory:  Positive for cough and shortness of breath.   Cardiovascular:  Positive for palpitations. Negative for chest pain.  Gastrointestinal:  Positive for nausea. Negative for abdominal pain, blood in stool, melena and vomiting.  Neurological:  Positive for dizziness, tingling and headaches.   Psychiatric/Behavioral:  The patient has insomnia.      PHYSICAL EXAM: (per limitations of virtual telephone visit)  The patient is alert and oriented x 3, exhibiting adequate mentation, good mood, and ability to speak in full sentences and execute sound judgement.  ASSESSMENT & PLAN:  1.  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 Marletta Lor in November 2023, but this was canceled due to transportation issues - She was unable to tolerate oral iron tablets due to nausea and indigestion - Most recent Venofer in December 2023 (tolerates better with IV ZOFRAN) - She has occasional hematochezia associated with burning pain in her lower abdomen - Chronic fatigue and dyspnea on exertion are at baseline - Most recent labs (05/24/2022): Hgb 13.9/MCV 96.6, ferritin 98, iron saturation 31% - PLAN: No IV iron at this time - We will repeat labs (CBC/D, ferritin, iron/TIBC) in 6 months followed by office visit. - Patient advised to call [facility] to reschedule her EGD/colonoscopy that was missed in January 2024   2.  Vitamin B12 deficiency - She was previously taking monthly B12 injections and sublingual B12 replacement, but these were stopped after her labs from September 2023 showed significantly elevated vitamin B12 - Labs from February 2024 showed normal but significantly lower B12, so she was recommended to restart sublingual B12 tablets, which she has been taking every other day for the past 3 months  - Most recent B12 levels (05/24/2022): Vitamin B12 >7500, MMA 174 - PLAN: At this time, she can STOP taking B12 supplements  for the time being. - We we will recheck B12/MMA at her follow-up visit in 6 months   PLAN SUMMARY: >> Labs in 6 months (CBC/D, ferritin, iron/TIBC, B12, MMA) >> OFFICE visit 1 week after labs  **Next office visit due around November 2024     I  discussed the assessment and treatment plan with the patient. The patient was provided an opportunity to ask questions and all were answered. The patient agreed with the plan and demonstrated an understanding of the instructions.   The patient was advised to call back or seek an in-person evaluation if the symptoms worsen or if the condition fails to improve as anticipated.  I provided 18 minutes of non-face-to-face time during this encounter.  Carnella Guadalajara, PA-C 06/01/2022 12:08 PM

## 2022-06-01 ENCOUNTER — Inpatient Hospital Stay (HOSPITAL_BASED_OUTPATIENT_CLINIC_OR_DEPARTMENT_OTHER): Payer: Medicaid Other | Admitting: Physician Assistant

## 2022-06-01 DIAGNOSIS — E538 Deficiency of other specified B group vitamins: Secondary | ICD-10-CM

## 2022-06-01 DIAGNOSIS — D508 Other iron deficiency anemias: Secondary | ICD-10-CM | POA: Diagnosis not present

## 2022-06-05 ENCOUNTER — Other Ambulatory Visit: Payer: Self-pay

## 2022-06-05 DIAGNOSIS — D508 Other iron deficiency anemias: Secondary | ICD-10-CM

## 2022-06-05 DIAGNOSIS — D509 Iron deficiency anemia, unspecified: Secondary | ICD-10-CM

## 2022-06-05 DIAGNOSIS — E538 Deficiency of other specified B group vitamins: Secondary | ICD-10-CM

## 2022-08-06 ENCOUNTER — Encounter (HOSPITAL_COMMUNITY): Payer: Self-pay

## 2022-08-06 ENCOUNTER — Other Ambulatory Visit: Payer: Self-pay

## 2022-08-06 ENCOUNTER — Emergency Department (HOSPITAL_COMMUNITY)
Admission: EM | Admit: 2022-08-06 | Discharge: 2022-08-06 | Disposition: A | Discharge status: Home / Self Care | Payer: Medicaid Other | Attending: Emergency Medicine | Admitting: Emergency Medicine

## 2022-08-06 ENCOUNTER — Emergency Department (HOSPITAL_COMMUNITY): Payer: Medicaid Other

## 2022-08-06 DIAGNOSIS — R002 Palpitations: Secondary | ICD-10-CM | POA: Diagnosis present

## 2022-08-06 DIAGNOSIS — R000 Tachycardia, unspecified: Secondary | ICD-10-CM | POA: Insufficient documentation

## 2022-08-06 LAB — CBC
HCT: 43.8 % (ref 36.0–46.0)
Hemoglobin: 14.2 g/dL (ref 12.0–15.0)
MCH: 31.8 pg (ref 26.0–34.0)
MCHC: 32.4 g/dL (ref 30.0–36.0)
MCV: 98 fL (ref 80.0–100.0)
Platelets: 216 10*3/uL (ref 150–400)
RBC: 4.47 MIL/uL (ref 3.87–5.11)
RDW: 13.6 % (ref 11.5–15.5)
WBC: 4.9 10*3/uL (ref 4.0–10.5)
nRBC: 0 % (ref 0.0–0.2)

## 2022-08-06 LAB — BASIC METABOLIC PANEL
Anion gap: 6 (ref 5–15)
BUN: 10 mg/dL (ref 8–23)
CO2: 19 mmol/L — ABNORMAL LOW (ref 22–32)
Calcium: 8 mg/dL — ABNORMAL LOW (ref 8.9–10.3)
Chloride: 110 mmol/L (ref 98–111)
Creatinine, Ser: 0.69 mg/dL (ref 0.44–1.00)
GFR, Estimated: >60 mL/min (ref >60)
Glucose, Bld: 97 mg/dL (ref 70–99)
Potassium: 3.8 mmol/L (ref 3.5–5.1)
Sodium: 135 mmol/L (ref 135–145)

## 2022-08-06 LAB — TROPONIN I (HIGH SENSITIVITY): Troponin I (High Sensitivity): 3 ng/L (ref <18)

## 2022-08-06 NOTE — Discharge Instructions (Signed)
Follow-up with card urology in the next couple weeks.  They should get in touch with you but if they do not give them a call.  Return if problems

## 2022-08-06 NOTE — ED Triage Notes (Signed)
Pt reports having episodes of feeling like her heart is racing and was told to come to ER by PCP.

## 2022-08-06 NOTE — ED Provider Notes (Signed)
EMERGENCY DEPARTMENT AT [facility] Provider Note   CSN: [id number] Arrival date & time: 08/06/22  1444     History {Add pertinent medical, surgical, social history, OB history to HPI:1} Chief Complaint  Patient presents with   Tachycardia    Gabriella Hodges is a 62 y.o. female.  Patient complains of some palpitations.  She has a history of thyroid disease and anxiety   Palpitations      Home Medications Prior to Admission medications   Medication Sig Start Date End Date Taking? Authorizing Provider  ALPRAZolam Prudy Feeler) 0.5 MG tablet Take 0.5 mg by mouth daily as needed for anxiety.    [provider]  cephALEXin (KEFLEX) 500 MG capsule Take 500 mg by mouth 4 (four) times daily. 02/20/22   [provider]  docusate sodium (COLACE) 100 MG capsule Take 100 mg by mouth daily.    [provider]  ibuprofen (ADVIL) 200 MG tablet Take 400 mg by mouth every 6 (six) hours as needed for moderate pain.    [provider]  levothyroxine (SYNTHROID, LEVOTHROID) 175 MCG tablet Take 175 mcg by mouth daily.    [provider]  omeprazole (PRILOSEC) 40 MG capsule Take 1 capsule (40 mg total) by mouth daily. 12/20/11   Christiane Ha, MD  ondansetron (ZOFRAN-ODT) 4 MG disintegrating tablet Take 1 tablet (4 mg total) by mouth every 8 (eight) hours as needed for nausea or vomiting. 11/21/21   Carnella Guadalajara, PA-C  PHENobarbital (LUMINAL) 97.2 MG tablet Take 97.2 mg by mouth at bedtime.      [provider]      Allergies    Shellfish-derived products, Nsaids, Penicillins, and Sulfa antibiotics    Review of Systems   Review of Systems  Cardiovascular:  Positive for palpitations.    Physical Exam Updated Vital Signs BP 138/80   Pulse (!) 56   Temp 98.4 F (36.9 C) (Oral)   Resp 15   Ht 5\' 3"  (1.6 m)   Wt 79.8 kg   SpO2 97%   BMI 31.18 kg/m  Physical Exam  ED Results / Procedures / Treatments    Labs (all labs ordered are listed, but only abnormal results are displayed) Labs Reviewed  BASIC METABOLIC PANEL - Abnormal; Notable for the following components:      Result Value   CO2 19 (*)    Calcium 8.0 (*)    All other components within normal limits  CBC  TROPONIN I (HIGH SENSITIVITY)    EKG None  Radiology DG Chest Port 1 View  Result Date: 08/06/2022 CLINICAL DATA:  Palpitations EXAM: PORTABLE CHEST 1 VIEW COMPARISON:  Chest radiograph 03/02/2010 FINDINGS: The heart is at the upper limits of normal for size. The upper mediastinal contours are normal. There are coarsened interstitial markings in both lungs, favored chronic. There is no focal airspace consolidation. There is no pulmonary edema. There is no pleural effusion or pneumothorax There is no acute osseous abnormality. IMPRESSION: Borderline cardiomegaly and mildly coarsened interstitial markings in both lungs, favored chronic. No focal airspace consolidation or pleural effusion. Electronically Signed   By: Lesia Hausen M.D.   On: 08/06/2022 16:15    Procedures Procedures  {Document cardiac monitor, telemetry assessment procedure when appropriate:1}  Medications Ordered in ED Medications - No data to display  ED Course/ Medical Decision Making/ A&P   {   Click here for ABCD2, HEART and other calculatorsREFRESH Note before signing :1}  Medical Decision Making Amount and/or Complexity of Data Reviewed Labs: ordered. Radiology: ordered.   Patient with palpitations and occasional unifocal PVCs.  She is referred to cardiology for further workup  {Document critical care time when appropriate:1} {Document review of labs and clinical decision tools ie heart score, Chads2Vasc2 etc:1}  {Document your independent review of radiology images, and any outside records:1} {Document your discussion with family members, caretakers, and with consultants:1} {Document social determinants of health  affecting pt's care:1} {Document your decision making why or why not admission, treatments were needed:1} Final Clinical Impression(s) / ED Diagnoses Final diagnoses:  Palpitations    Rx / DC Orders ED Discharge Orders          Ordered    Ambulatory referral to Cardiology       Comments: If you have not heard from the Cardiology office within the next 72 hours please call [phone].   08/06/22 8295

## 2022-09-20 ENCOUNTER — Encounter: Payer: Self-pay | Admitting: Internal Medicine

## 2022-09-20 ENCOUNTER — Ambulatory Visit: Payer: Medicaid Other | Attending: Internal Medicine | Admitting: Internal Medicine

## 2022-09-20 VITALS — BP 140/80 | HR 70 | Ht 62.0 in | Wt 172.4 lb

## 2022-09-20 DIAGNOSIS — R002 Palpitations: Secondary | ICD-10-CM | POA: Diagnosis not present

## 2022-09-20 NOTE — Progress Notes (Signed)
Cardiology Office Note  Date: 09/20/2022   ID: Gabriella Hodges, DOB 08-Dec-1960, MRN [id number]  PCP:  Assunta Found, MD  Cardiologist:  None Electrophysiologist:  None   History of Present Illness: Gabriella Hodges is a 62 y.o. female known to have GERD, anxiety was referred to cardiology clinic for evaluation of palpitations.  Patient had ER visit in 07/2022 with a chief complaint of chest pain.  She had sudden onset of chest pain associated with dizziness, palpitations, nausea and vomiting.  The chest pain lasted for 1 to 2 hours that prompted ER visit.  She did not have any food poisoning.  EKG in the ER showed NSR and frequent PVCs.  She did not have any recurrence of chest pain since then.  She drinks 2 bottles of coffee every day.  No DOE, orthopnea, PND, syncope.  No leg swelling.  She is physically active at baseline, METs more than 4.  Past Medical History:  Diagnosis Date   Anxiety    Epilepsy (HCC)    GERD (gastroesophageal reflux disease)    Hypothyroidism    Thyroid disease     Past Surgical History:  Procedure Laterality Date   GASTRIC BYPASS     THYROID SURGERY     tumors   tumors     breast-benign    Current Outpatient Medications  Medication Sig Dispense Refill   cephALEXin (KEFLEX) 500 MG capsule Take 500 mg by mouth 4 (four) times daily.     docusate sodium (COLACE) 100 MG capsule Take 100 mg by mouth daily.     ibuprofen (ADVIL) 200 MG tablet Take 400 mg by mouth every 6 (six) hours as needed for moderate pain.     levothyroxine (SYNTHROID, LEVOTHROID) 175 MCG tablet Take 175 mcg by mouth daily.     omeprazole (PRILOSEC) 40 MG capsule Take 1 capsule (40 mg total) by mouth daily. 30 capsule 0   ondansetron (ZOFRAN-ODT) 4 MG disintegrating tablet Take 1 tablet (4 mg total) by mouth every 8 (eight) hours as needed for nausea or vomiting. 30 tablet 0   PHENobarbital (LUMINAL) 97.2 MG tablet Take 97.2 mg by mouth at bedtime.       No current  facility-administered medications for this visit.   Allergies:  Shellfish-derived products, Nsaids, Penicillins, and Sulfa antibiotics   Social History: The patient  reports that she has been smoking cigarettes. She has a 15 pack-year smoking history. She has never used smokeless tobacco. She reports that she does not drink alcohol and does not use drugs.   Family History: The patient's family history includes Breast cancer in her sister; Cancer in an other family member; Diabetes in an other family member; Heart disease in an other family member; Lung disease in an other family member; Thyroid cancer in her mother.   ROS:  Please see the history of present illness. Otherwise, complete review of systems is positive for none  All other systems are reviewed and negative.   Physical Exam: VS:  BP (!) 140/80   Pulse 70   Ht 5\' 2"  (1.575 m)   Wt 172 lb 6.4 oz (78.2 kg)   SpO2 97%   BMI 31.53 kg/m , BMI Body mass index is 31.53 kg/m.  Wt Readings from Last 3 Encounters:  09/20/22 172 lb 6.4 oz (78.2 kg)  08/06/22 176 lb (79.8 kg)  11/14/21 163 lb (73.9 kg)    General: Patient appears comfortable at rest. HEENT: Conjunctiva and lids normal, oropharynx clear  with moist mucosa. Neck: Supple, no elevated JVP or carotid bruits, no thyromegaly. Lungs: Clear to auscultation, nonlabored breathing at rest. Cardiac: Regular rate and rhythm, no S3 or significant systolic murmur, no pericardial rub. Abdomen: Soft, nontender, no hepatomegaly, bowel sounds present, no guarding or rebound. Extremities: No pitting edema, distal pulses 2+. Skin: Warm and dry. Musculoskeletal: No kyphosis. Neuropsychiatric: Alert and oriented x3, affect grossly appropriate.  Recent Labwork: 08/06/2022: BUN 10; Creatinine, Ser 0.69; Hemoglobin 14.2; Platelets 216; Potassium 3.8; Sodium 135  No results found for: "CHOL", "TRIG", "HDL", "CHOLHDL", "VLDL", "LDLCALC", "LDLDIRECT"   Assessment and Plan:   Palpitations  likely secondary to excessive caffeine intake: Patient drinks 2 bottles of coffee.  EKG showed NSR and PVCs. Strongly encouraged patient to cut back on coffee and switch to decaffeinated version.  Chest pain: Patient had 1 episode of chest pain at rest associated with palpitations, nausea and vomiting prompting ER visit in 07/2022.  This chest pain episode lasted for 1 to 2 hours.  No recurrence since then.  She is physically active at baseline, METs more than 4.  No indication of cardiac testing at this time, I will reevaluate her symptoms in 6 months to determine if she needs any ischemia evaluation.      Medication Adjustments/Labs and Tests Ordered: Current medicines are reviewed at length with the patient today.  Concerns regarding medicines are outlined above.    Disposition:  Follow up  6 months  Signed Yasamin Karel Verne Spurr, MD, 09/20/2022 2:23 PM    [facility] [address] [place], [place], [place] [address]

## 2022-09-20 NOTE — Patient Instructions (Addendum)

## 2022-09-25 ENCOUNTER — Encounter (HOSPITAL_COMMUNITY): Payer: Self-pay

## 2022-09-25 ENCOUNTER — Emergency Department (HOSPITAL_COMMUNITY)
Admission: EM | Admit: 2022-09-25 | Discharge: 2022-09-25 | Disposition: A | Discharge status: Home / Self Care | Payer: Medicaid Other | Attending: Emergency Medicine | Admitting: Emergency Medicine

## 2022-09-25 DIAGNOSIS — K047 Periapical abscess without sinus: Secondary | ICD-10-CM | POA: Diagnosis not present

## 2022-09-25 DIAGNOSIS — K0889 Other specified disorders of teeth and supporting structures: Secondary | ICD-10-CM | POA: Diagnosis present

## 2022-09-25 MED ORDER — TRAMADOL HCL 50 MG PO TABS: ORAL_TABLET | ORAL | 0 refills | Status: DC

## 2022-09-25 MED ORDER — CLINDAMYCIN HCL 300 MG PO CAPS: 300.0000 mg | ORAL_CAPSULE | Freq: Four times a day (QID) | ORAL | 0 refills | Status: DC

## 2022-09-25 NOTE — ED Triage Notes (Signed)
Pt c/o increasing swelling in upper lip and around nose x1 day.  Denies pain.  Pt reports most of her teeth are broken off d/t iron infusions.  Sts she is unable to afford a dentist.  No tongue or throat swelling noted.

## 2022-09-25 NOTE — Discharge Instructions (Signed)
Follow-up with a dentist in the next couple weeks

## 2022-09-25 NOTE — ED Provider Notes (Signed)
[place] EMERGENCY DEPARTMENT AT [facility] Provider Note   CSN: [id number] Arrival date & time: 09/25/22  0732     History {Add pertinent medical, surgical, social history, OB history to HPI:1} Chief Complaint  Patient presents with   Facial Swelling    Gabriella Hodges is a 62 y.o. female.  Patient with history of thyroid disease.  She also has history of poor dentition.  She complains of pain in her upper  gum and going into her nose.   Dental Pain      Home Medications Prior to Admission medications   Medication Sig Start Date End Date Taking? Authorizing Provider  clindamycin (CLEOCIN) 300 MG capsule Take 1 capsule (300 mg total) by mouth 4 (four) times daily. X 7 days 09/25/22  Yes Bethann Berkshire, MD  traMADol Janean Sark) 50 MG tablet Take tramadol every 6-8 hours for pain that is not relieved by Tylenol 09/25/22  Yes Bethann Berkshire, MD  cephALEXin (KEFLEX) 500 MG capsule Take 500 mg by mouth 4 (four) times daily. 02/20/22   [provider]  docusate sodium (COLACE) 100 MG capsule Take 100 mg by mouth daily.    [provider]  ibuprofen (ADVIL) 200 MG tablet Take 400 mg by mouth every 6 (six) hours as needed for moderate pain.    [provider]  levothyroxine (SYNTHROID, LEVOTHROID) 175 MCG tablet Take 175 mcg by mouth daily.    [provider]  omeprazole (PRILOSEC) 40 MG capsule Take 1 capsule (40 mg total) by mouth daily. 12/20/11   Christiane Ha, MD  ondansetron (ZOFRAN-ODT) 4 MG disintegrating tablet Take 1 tablet (4 mg total) by mouth every 8 (eight) hours as needed for nausea or vomiting. 11/21/21   Carnella Guadalajara, PA-C  PHENobarbital (LUMINAL) 97.2 MG tablet Take 97.2 mg by mouth at bedtime.      [provider]      Allergies    Shellfish-derived products, Nsaids, Penicillins, and Sulfa antibiotics    Review of Systems   Review of Systems  Physical Exam Updated Vital Signs BP (!) 153/69  (BP Location: Right Arm)   Pulse (!) 57   Temp 97.9 F (36.6 C) (Oral)   Resp 16   Ht 5\' 2"  (1.575 m)   Wt 78 kg   SpO2 100%   BMI 31.46 kg/m  Physical Exam  ED Results / Procedures / Treatments   Labs (all labs ordered are listed, but only abnormal results are displayed) Labs Reviewed - No data to display  EKG None  Radiology No results found.  Procedures Procedures  {Document cardiac monitor, telemetry assessment procedure when appropriate:1}  Medications Ordered in ED Medications - No data to display  ED Course/ Medical Decision Making/ A&P   {   Click here for ABCD2, HEART and other calculatorsREFRESH Note before signing :1}                              Medical Decision Making Risk Prescription drug management.   Patient with poor dentition and dental infections.  She is put on clindamycin and given tramadol for pain and will follow-up with a dentist  {Document critical care time when appropriate:1} {Document review of labs and clinical decision tools ie heart score, Chads2Vasc2 etc:1}  {Document your independent review of radiology images, and any outside records:1} {Document your discussion with family members, caretakers, and with consultants:1} {Document social determinants of health affecting  pt's care:1} {Document your decision making why or why not admission, treatments were needed:1} Final Clinical Impression(s) / ED Diagnoses Final diagnoses:  Dental abscess    Rx / DC Orders ED Discharge Orders          Ordered    clindamycin (CLEOCIN) 300 MG capsule  4 times daily        09/25/22 0803    traMADol (ULTRAM) 50 MG tablet        09/25/22 0803

## 2022-09-26 ENCOUNTER — Other Ambulatory Visit (HOSPITAL_COMMUNITY): Payer: Self-pay | Admitting: Family Medicine

## 2022-09-26 ENCOUNTER — Ambulatory Visit (HOSPITAL_COMMUNITY)
Admission: RE | Admit: 2022-09-26 | Discharge: 2022-09-26 | Disposition: A | Discharge status: Home / Self Care | Payer: Medicaid Other | Source: Ambulatory Visit | Attending: Family Medicine | Admitting: Family Medicine

## 2022-09-26 DIAGNOSIS — J439 Emphysema, unspecified: Secondary | ICD-10-CM | POA: Insufficient documentation

## 2022-09-26 DIAGNOSIS — J4489 Other specified chronic obstructive pulmonary disease: Secondary | ICD-10-CM | POA: Diagnosis present

## 2022-10-17 ENCOUNTER — Encounter: Payer: Self-pay | Admitting: Hematology

## 2022-10-23 IMAGING — DX DG KNEE COMPLETE 4+V*L*
4 series · 4 of 4 positions shown · non-contrast
Comparison: 06/15/2018

CLINICAL DATA: Trauma left lateral upper leg and knee, inability to
ambulate, bruising

EXAM:
LEFT KNEE - COMPLETE 4+ VIEW

[knee ap]
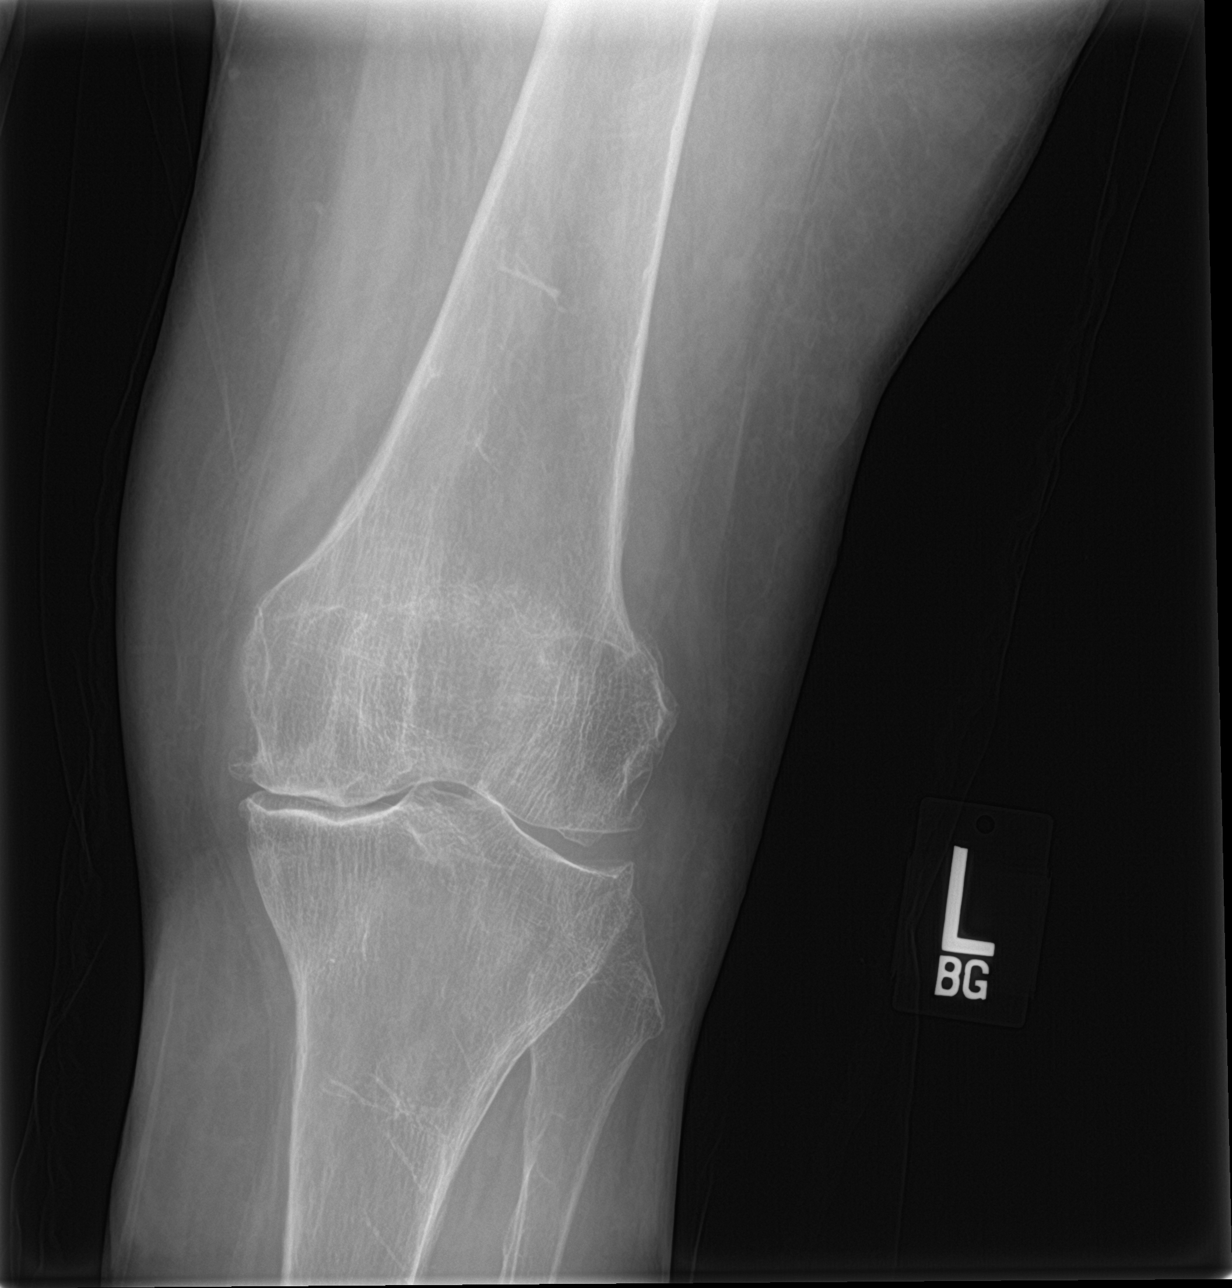

[knee obl (1 of 2)]
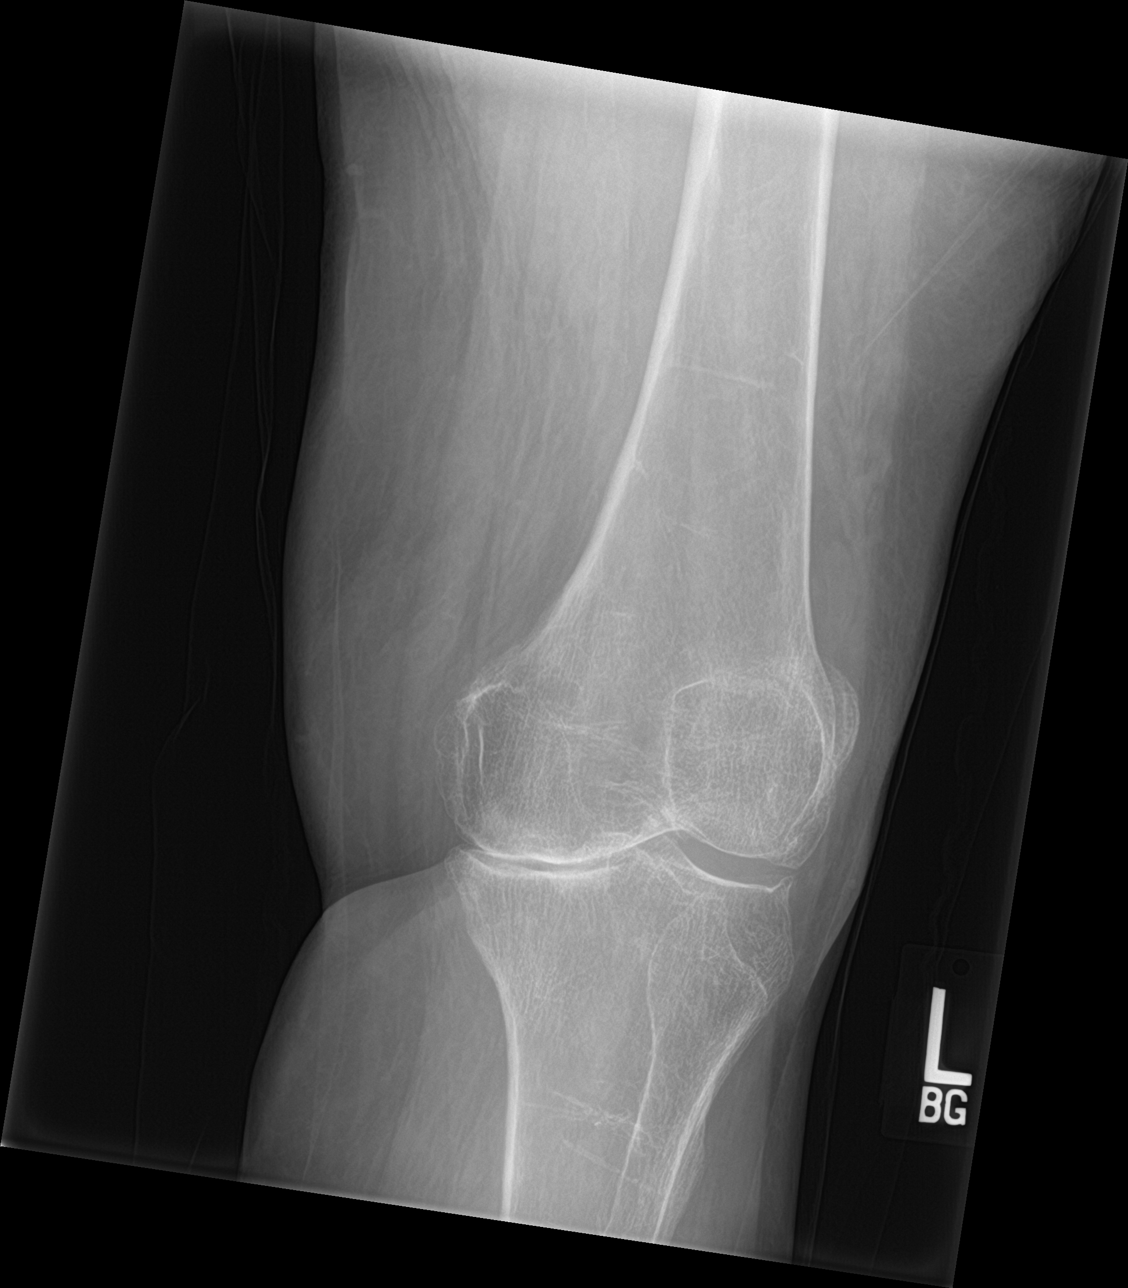

[knee obl (2 of 2)]
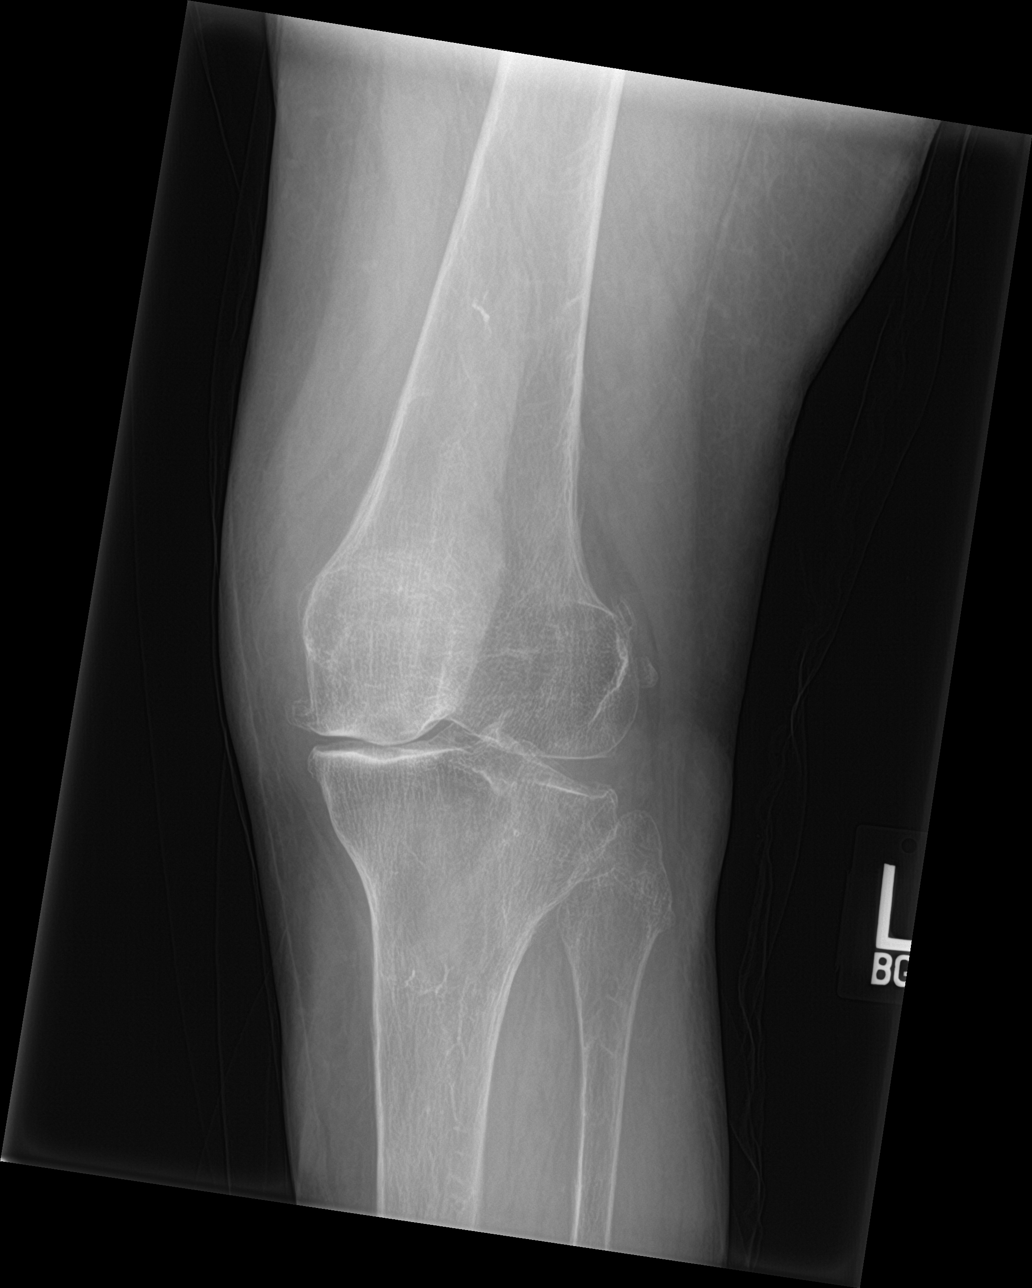

[knee lat]
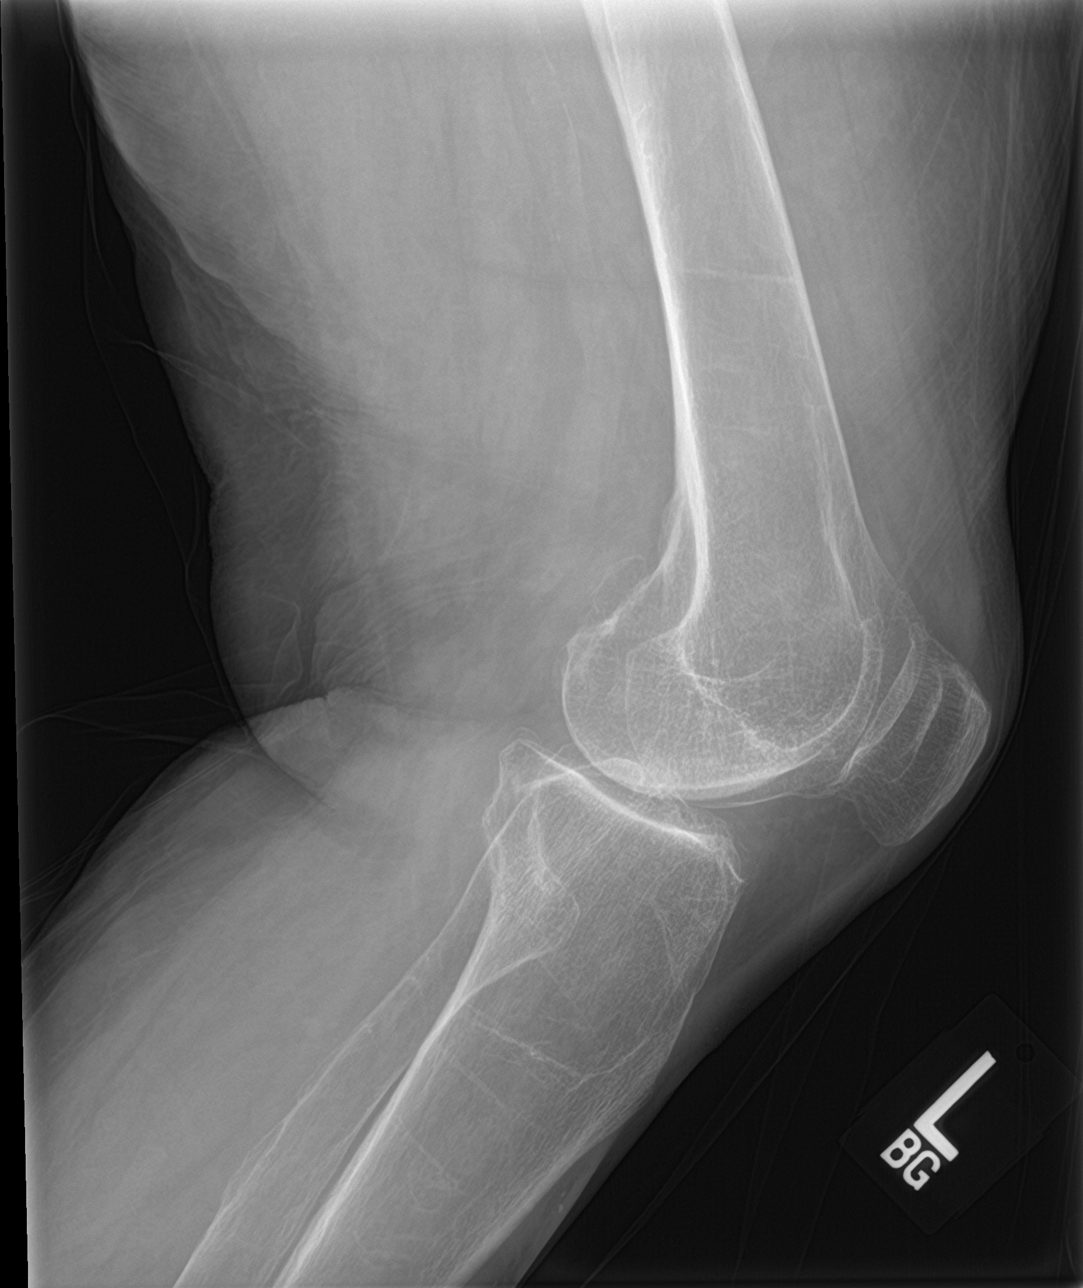

[4 of 4 positions shown; findings below may reference images not displayed]

FINDINGS: Frontal, bilateral oblique, and lateral views of the left knee are
obtained. The bones are severely osteopenic. No fracture,
subluxation, or dislocation. There is moderate 3 compartmental
osteoarthritis greatest in the medial compartment. No joint
effusion. The soft tissues are unremarkable.
IMPRESSION: 1. Moderate 3 compartmental osteoarthritis.
2. No acute displaced fracture.
3. Osteopenia.

## 2022-11-19 ENCOUNTER — Inpatient Hospital Stay: Payer: Medicaid Other | Attending: Hematology

## 2022-11-19 DIAGNOSIS — Z833 Family history of diabetes mellitus: Secondary | ICD-10-CM | POA: Insufficient documentation

## 2022-11-19 DIAGNOSIS — Z79899 Other long term (current) drug therapy: Secondary | ICD-10-CM | POA: Insufficient documentation

## 2022-11-19 DIAGNOSIS — Z809 Family history of malignant neoplasm, unspecified: Secondary | ICD-10-CM | POA: Diagnosis not present

## 2022-11-19 DIAGNOSIS — R5382 Chronic fatigue, unspecified: Secondary | ICD-10-CM | POA: Insufficient documentation

## 2022-11-19 DIAGNOSIS — R079 Chest pain, unspecified: Secondary | ICD-10-CM | POA: Diagnosis not present

## 2022-11-19 DIAGNOSIS — Z8249 Family history of ischemic heart disease and other diseases of the circulatory system: Secondary | ICD-10-CM | POA: Insufficient documentation

## 2022-11-19 DIAGNOSIS — Z882 Allergy status to sulfonamides status: Secondary | ICD-10-CM | POA: Diagnosis not present

## 2022-11-19 DIAGNOSIS — K219 Gastro-esophageal reflux disease without esophagitis: Secondary | ICD-10-CM | POA: Diagnosis not present

## 2022-11-19 DIAGNOSIS — Z880 Allergy status to penicillin: Secondary | ICD-10-CM | POA: Insufficient documentation

## 2022-11-19 DIAGNOSIS — Z808 Family history of malignant neoplasm of other organs or systems: Secondary | ICD-10-CM | POA: Diagnosis not present

## 2022-11-19 DIAGNOSIS — E669 Obesity, unspecified: Secondary | ICD-10-CM | POA: Diagnosis not present

## 2022-11-19 DIAGNOSIS — G479 Sleep disorder, unspecified: Secondary | ICD-10-CM | POA: Diagnosis not present

## 2022-11-19 DIAGNOSIS — R0602 Shortness of breath: Secondary | ICD-10-CM | POA: Insufficient documentation

## 2022-11-19 DIAGNOSIS — R519 Headache, unspecified: Secondary | ICD-10-CM | POA: Diagnosis not present

## 2022-11-19 DIAGNOSIS — D508 Other iron deficiency anemias: Secondary | ICD-10-CM

## 2022-11-19 DIAGNOSIS — F17210 Nicotine dependence, cigarettes, uncomplicated: Secondary | ICD-10-CM | POA: Insufficient documentation

## 2022-11-19 DIAGNOSIS — Z825 Family history of asthma and other chronic lower respiratory diseases: Secondary | ICD-10-CM | POA: Diagnosis not present

## 2022-11-19 DIAGNOSIS — D509 Iron deficiency anemia, unspecified: Secondary | ICD-10-CM | POA: Diagnosis present

## 2022-11-19 DIAGNOSIS — R0609 Other forms of dyspnea: Secondary | ICD-10-CM | POA: Insufficient documentation

## 2022-11-19 DIAGNOSIS — R110 Nausea: Secondary | ICD-10-CM | POA: Insufficient documentation

## 2022-11-19 DIAGNOSIS — Z803 Family history of malignant neoplasm of breast: Secondary | ICD-10-CM | POA: Diagnosis not present

## 2022-11-19 DIAGNOSIS — E538 Deficiency of other specified B group vitamins: Secondary | ICD-10-CM | POA: Diagnosis not present

## 2022-11-19 DIAGNOSIS — Z9884 Bariatric surgery status: Secondary | ICD-10-CM | POA: Diagnosis not present

## 2022-11-19 DIAGNOSIS — Z886 Allergy status to analgesic agent status: Secondary | ICD-10-CM | POA: Insufficient documentation

## 2022-11-19 DIAGNOSIS — R002 Palpitations: Secondary | ICD-10-CM | POA: Insufficient documentation

## 2022-11-19 LAB — CBC WITH DIFFERENTIAL/PLATELET
Abs Immature Granulocytes: 0.02 10*3/uL (ref 0.00–0.07)
Basophils Absolute: 0.1 10*3/uL (ref 0.0–0.1)
Basophils Relative: 1 %
Eosinophils Absolute: 0.3 10*3/uL (ref 0.0–0.5)
Eosinophils Relative: 5 %
HCT: 42.2 % (ref 36.0–46.0)
Hemoglobin: 14 g/dL (ref 12.0–15.0)
Immature Granulocytes: 0 %
Lymphocytes Relative: 23 %
Lymphs Abs: 1.5 10*3/uL (ref 0.7–4.0)
MCH: 32.6 pg (ref 26.0–34.0)
MCHC: 33.2 g/dL (ref 30.0–36.0)
MCV: 98.4 fL (ref 80.0–100.0)
Monocytes Absolute: 0.6 10*3/uL (ref 0.1–1.0)
Monocytes Relative: 9 %
Neutro Abs: 4.1 10*3/uL (ref 1.7–7.7)
Neutrophils Relative %: 62 %
Platelets: 257 10*3/uL (ref 150–400)
RBC: 4.29 MIL/uL (ref 3.87–5.11)
RDW: 15.4 % (ref 11.5–15.5)
WBC: 6.5 10*3/uL (ref 4.0–10.5)
nRBC: 0 % (ref 0.0–0.2)

## 2022-11-19 LAB — IRON AND TIBC
Iron: 60 ug/dL (ref 28–170)
Saturation Ratios: 21 % (ref 10.4–31.8)
TIBC: 292 ug/dL (ref 250–450)
UIBC: 232 ug/dL

## 2022-11-19 LAB — FERRITIN: Ferritin: 64 ng/mL (ref 11–307)

## 2022-11-19 LAB — VITAMIN B12: Vitamin B-12: 358 pg/mL (ref 180–914)

## 2022-11-20 ENCOUNTER — Encounter: Payer: Self-pay | Admitting: Internal Medicine

## 2022-11-20 ENCOUNTER — Ambulatory Visit (INDEPENDENT_AMBULATORY_CARE_PROVIDER_SITE_OTHER): Payer: Medicaid Other | Admitting: Internal Medicine

## 2022-11-20 VITALS — BP 129/69 | HR 93 | Ht 62.0 in | Wt 175.0 lb

## 2022-11-20 DIAGNOSIS — J449 Chronic obstructive pulmonary disease, unspecified: Secondary | ICD-10-CM | POA: Diagnosis not present

## 2022-11-20 DIAGNOSIS — F17210 Nicotine dependence, cigarettes, uncomplicated: Secondary | ICD-10-CM | POA: Insufficient documentation

## 2022-11-20 MED ORDER — FAMOTIDINE 20 MG PO TABS: ORAL_TABLET | ORAL | 11 refills | Status: DC

## 2022-11-20 MED ORDER — BREZTRI AEROSPHERE 160-9-4.8 MCG/ACT IN AERO: INHALATION_SPRAY | RESPIRATORY_TRACT | 11 refills | Status: DC

## 2022-11-20 NOTE — Patient Instructions (Addendum)
Plan A = Automatic = Always=    Breztri Take 2 puffs first thing in am and then another 2 puffs about 12 hours later.    Work on inhaler technique:  relax and gently blow all the way out then take a nice smooth full deep breath back in, triggering the inhaler at same time you start breathing in.  Hold breath in for at least  5 seconds if you can. Blow out breztri  thru nose. Rinse and gargle with water when done.  If mouth or throat bother you at all,  try brushing teeth/gums/tongue with arm and hammer toothpaste/ make a slurry and gargle and spit out.       Plan B = Backup (to supplement plan A, not to replace it) Only use your albuterol inhaler as a rescue medication to be used if you can't catch your breath by resting or doing a relaxed purse lip breathing pattern.  - The less you use it, the better it will work when you need it. - Ok to use the inhaler up to 2 puffs  every 4 hours if you must but call for appointment if use goes up over your usual need - Don't leave home without it !!  (think of it like the spare tire for your car)     For cough/ congestion > mucinex or mucinex dm  up to maximum of  1200 mg every 12 hours as needed    Omeprzole 40 mg   Take  30-60 min before first meal of the day and Pepcid (famotidine)  20 mg after supper until return to office - this is the best way to tell whether stomach acid is contributing to your problem.      My office will be contacting you by phone for referral to PFTs and lung cancer screening  [phone] - if you don't hear back from my office within one week please call us back or notify us thru MyChart and we'll address it right away.    Please schedule a follow up visit in 3 months but call sooner if needed

## 2022-11-20 NOTE — Progress Notes (Signed)
Gabriella Hodges, female    DOB: 05-28-1960    MRN: [id number]   Brief patient profile:  43  yowf  active smoker  referred to pulmonary clinic in [place]  11/20/2022 by Dr Phillips Odor  for doe and cough x 2023  indolent onset and mildly progressive, some better p rx with ventolin prn     History of Present Illness  11/20/2022  Pulmonary/ 1st office eval/ Yudith Norlander / [place] [facility]  Chief Complaint  Patient presents with   Establish Care  Dyspnea:  raking leaves x 30 min  vs sev hours prior to onset  Cough:   thick and sometimes green Sleep: sleeps in chair x 8 y  p thyroid surgery for cancer [facility]  SABA use: avg 2 x daily  02: none  Lung cancer screen: referred 11/20/2022   No obvious day to day or daytime pattern/variability or assoc   mucus plugs or hemoptysis or cp or chest tightness, subjective wheeze or overt sinus or hb symptoms.    Also denies any obvious fluctuation of symptoms with weather or environmental changes or other aggravating or alleviating factors except as outlined above   No unusual exposure hx or h/o childhood pna/ asthma or knowledge of premature birth.  Current Allergies, Complete Past Medical History, Past Surgical History, Family History, and Social History were reviewed in [employer] record.  ROS  The following are not active complaints unless bolded Hoarseness, sore throat, dysphagia (p thyroid surg), dental problems, itching, sneezing,  nasal congestion or discharge of excess mucus or purulent secretions, ear ache,   fever, chills, sweats, unintended wt loss or wt gain, classically pleuritic or exertional cp,  orthopnea since thyroid sugery  pnd or arm/hand swelling  or leg swelling, presyncope, palpitations, abdominal pain, anorexia, nausea, vomiting, diarrhea  or change in bowel habits or change in bladder habits, change in stools or change in urine, dysuria, hematuria,  rash, arthralgias, visual complaints, headache, numbness,  weakness or ataxia or problems with walking or coordination,  change in mood or  memory.            Outpatient Medications Prior to Visit  Medication Sig Dispense Refill   cephALEXin (KEFLEX) 500 MG capsule Take 500 mg by mouth 4 (four) times daily.     clindamycin (CLEOCIN) 300 MG capsule Take 1 capsule (300 mg total) by mouth 4 (four) times daily. X 7 days 28 capsule 0   docusate sodium (COLACE) 100 MG capsule Take 100 mg by mouth daily.     ibuprofen (ADVIL) 200 MG tablet Take 400 mg by mouth every 6 (six) hours as needed for moderate pain.     levothyroxine (SYNTHROID, LEVOTHROID) 175 MCG tablet Take 175 mcg by mouth daily.     omeprazole (PRILOSEC) 40 MG capsule Take 1 capsule (40 mg total) by mouth daily. 30 capsule 0   ondansetron (ZOFRAN-ODT) 4 MG disintegrating tablet Take 1 tablet (4 mg total) by mouth every 8 (eight) hours as needed for nausea or vomiting. 30 tablet 0   PHENobarbital (LUMINAL) 97.2 MG tablet Take 97.2 mg by mouth at bedtime.       traMADol (ULTRAM) 50 MG tablet Take tramadol every 6-8 hours for pain that is not relieved by Tylenol 20 tablet 0   VENTOLIN HFA 108 (90 Base) MCG/ACT inhaler Inhale 2 puffs into the lungs every 4 (four) hours as needed.     No facility-administered medications prior to visit.    Past Medical History:  Diagnosis Date   Anxiety    Epilepsy (HCC)    GERD (gastroesophageal reflux disease)    Hypothyroidism    Thyroid disease       Objective:     BP 129/69   Pulse 93   Ht 5\' 2"  (1.575 m)   Wt 175 lb (79.4 kg)   SpO2 96%   BMI 32.01 kg/m   SpO2: 96 % RA  Chronically ill amb wf nad  HEENT : Oropharynx  clear/ poor dentiion   Nasal turbinates nl    NECK :  without  apparent JVD/ palpable Nodes/ large suprasternal notch, no palpable thyroid tissue / mild pseudowheeze    LUNGS: no acc muscle use,  Mild barrel  contour chest wall with bilateral  Distant bs s audible wheeze and  without cough on insp or exp maneuvers   and mild  Hyperresonant  to  percussion bilaterally     CV:  RRR  no s3 or murmur or increase in P2, and no edema   ABD:  soft and nontender with pos end  insp Hoover's  in the supine position.  No bruits or organomegaly appreciated   MS:  Nl gait/ ext warm without deformities Or obvious joint restrictions  calf tenderness, cyanosis or clubbing     SKIN: warm and dry without lesions    NEURO:  alert, approp, nl sensorium with  no motor or cerebellar deficits apparent.     I personally reviewed images and agree with radiology impression as follows:  CXR:   9/18/2 No active cardiopulmonary disease.     Assessment   COPD GOLD ? / AB Active smoker -  11/20/2022  After extensive coaching inhaler device,  effectiveness =    60% (short ti)   Clinically mild / mod but  Group D (now reclassified as E) in terms of symptom/risk and laba/lama/ICS  therefore appropriate rx at this point >>>  breztri and approp saba:  Re SABA :  I spent extra time with pt today reviewing appropriate use of albuterol for prn use on exertion with the following points: 1) saba is for relief of sob that does not improve by walking a slower pace or resting but rather if the pt does not improve after trying this first. 2) If the pt is convinced, as many are, that saba helps recover from activity faster then it's easy to tell if this is the case by re-challenging : ie stop, take the inhaler, then p 5 minutes try the exact same activity (intensity of workload) that just caused the symptoms and see if they are substantially diminished or not after saba 3) if there is an activity that reproducibly causes the symptoms, try the saba 15 min before the activity on alternate days   If in fact the saba really does help, then fine to continue to use it prn but advised may need to look closer at the maintenance regimen being used to achieve better control of airways disease with exertion.     Cigarette smoker Referred for LCS  11/20/2022 >>>   4-5 min discussion re active cigarette smoking in addition to office E&M  Ask about tobacco use:   ongoing  Advise quitting   I took an extended  opportunity with this patient to outline the consequences of continued cigarette use  in airway disorders based on all the data we have from the multiple national lung health studies (perfomed over decades at millions of dollars in cost)  indicating  that smoking cessation, not choice of inhalers or pulmonary physicians, is the most important aspect of her  care.  "Clean air for your lungs is like a cholesterol pill for your heart" speech.  Assess willingness:  Not committed at this point Assist in quit attempt:  Per PCP when ready Arrange follow up:   Follow up per Primary Care planned       Sandrea Hughs, MD 11/20/2022

## 2022-11-20 NOTE — Assessment & Plan Note (Addendum)
Referred for LCS 11/20/2022 >>>   4-5 min discussion re active cigarette smoking in addition to office E&M  Ask about tobacco use:   ongoing  Advise quitting   I took an extended  opportunity with this patient to outline the consequences of continued cigarette use  in airway disorders based on all the data we have from the multiple national lung health studies (perfomed over decades at millions of dollars in cost)  indicating that smoking cessation, not choice of inhalers or pulmonary physicians, is the most important aspect of her  care.  "Clean air for your lungs is like a cholesterol pill for your heart" speech.  Assess willingness:  Not committed at this point Assist in quit attempt:  Per PCP when ready Arrange follow up:   Follow up per Primary Care planned

## 2022-11-20 NOTE — Assessment & Plan Note (Addendum)
Active smoker -  11/20/2022  After extensive coaching inhaler device,  effectiveness =    60% (short ti)   Clinically mild / mod but  Group D (now reclassified as E) in terms of symptom/risk and laba/lama/ICS  therefore appropriate rx at this point >>>  breztri and approp saba:  Re SABA :  I spent extra time with pt today reviewing appropriate use of albuterol for prn use on exertion with the following points: 1) saba is for relief of sob that does not improve by walking a slower pace or resting but rather if the pt does not improve after trying this first. 2) If the pt is convinced, as many are, that saba helps recover from activity faster then it's easy to tell if this is the case by re-challenging : ie stop, take the inhaler, then p 5 minutes try the exact same activity (intensity of workload) that just caused the symptoms and see if they are substantially diminished or not after saba 3) if there is an activity that reproducibly causes the symptoms, try the saba 15 min before the activity on alternate days   If in fact the saba really does help, then fine to continue to use it prn but advised may need to look closer at the maintenance regimen being used to achieve better control of airways disease with exertion.      For cough > mucinex dm/ max gerd rx for now      Each maintenance medication was reviewed in detail including emphasizing most importantly the difference between maintenance and prns and under what circumstances the prns are to be triggered using an action plan format where appropriate.  Total time for H and P, chart review, counseling, reviewing hfa /neb  device(s) and generating customized AVS unique to this office visit / same day charting = 45 min new pt eval

## 2022-11-22 ENCOUNTER — Telehealth: Payer: Self-pay

## 2022-11-22 NOTE — Telephone Encounter (Signed)
Cover my Meds   Key [id number] Lesinski DOB: Aug 13, 1960  BREZTRI 160-9-4. 

## 2022-11-25 LAB — METHYLMALONIC ACID, SERUM: Methylmalonic Acid, Quantitative: 202 nmol/L (ref 0–378)

## 2022-11-25 NOTE — Progress Notes (Unsigned)
[facility] [address] [place] [address]   CLINIC:  Medical Oncology/Hematology  PCP:  Nyoka Cowden, MD [phone] [address] [place] [address] [phone]   REASON FOR VISIT:  Follow-up for iron deficiency anemia   CURRENT THERAPY: Intermittent IV iron  INTERVAL HISTORY:   Ms. Gabriella Hodges 62 y.o. female returns for routine follow-up of iron deficiency anemia.  She was last evaluated via telemedicine visit on 06/01/2022 by Rojelio Brenner PA-C.  Her last IV iron was with Venofer 300 mg x 2 doses on 11/21/2021 and 12/26/2021, given with IV Zofran to ameliorate her nausea.   She reports that she felt no different after previous IV iron infusions; continues to have chronic fatigue and "feel cold all the time."  *** *** She has not noticed any rectal bleeding for the past several months. *** She reports lightheadedness and chronic headaches.    *** No pica, chest pain, syncope. *** Chronic dyspnea on exertion is at baseline. *** No B12 or iron?  *** *** She has 40***% energy and 70***% appetite. She endorses that she is maintaining a stable weight.  ASSESSMENT & PLAN:  1.  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 Marletta Lor in November 2023, but this was canceled due to transportation issues - She was unable to tolerate oral iron tablets due to nausea and indigestion - Most recent Venofer in December 2023 (tolerates better with IV ZOFRAN) - She has occasional hematochezia associated with burning pain in her lower abdomen*** - Chronic fatigue and dyspnea on exertion are at baseline*** - Most recent labs (11/19/2022): Hgb 14.0/MCV 98.4, ferritin 64, iron saturation 21% - PLAN: No IV iron at this time *** IV iron if symptomatic?  *** - We will repeat labs (CBC/D, ferritin, iron/TIBC) in 6 months followed by PHONE *** visit. - Patient advised to call [facility] to reschedule her EGD/colonoscopy that was missed in January 2024***   2.  Vitamin B12 deficiency - She was previously taking monthly B12 injections and sublingual B12 replacement, but these were stopped after her labs from September 2023 showed significantly elevated vitamin B12 - Labs from February 2024 showed normal but borderline-low  B12, so she was recommended to restart sublingual B12 tablets, which she has been taking every other day for the past 3 months *** STOPPED after last visit?  *** - Most recent B12 levels (11/19/2022): Vitamin B12 358, MMA 202 - PLAN: Recommend sublingual vitamin B12 1000 mcg once weekly.  *** - We we will recheck B12/MMA at her follow-up visit in 6 months   PLAN SUMMARY: >> *** Iron TBA *** >> Labs in 6 months (CBC/D, ferritin, iron/TIBC, B12, MMA) >> OFFICE visit 1 week after labs   **Last office visit 11/26/2022 ***     REVIEW OF SYSTEMS: ***  Review of Systems - Oncology   PHYSICAL EXAM:  ECOG PERFORMANCE STATUS: {CHL ONC ECOG DD:[id number]} *** There were no vitals filed for this visit. There were no vitals filed for this visit. Physical Exam  PAST MEDICAL/SURGICAL HISTORY:  Past Medical History:  Diagnosis Date   Anxiety    Epilepsy (HCC)    GERD (gastroesophageal reflux disease)    Hypothyroidism    Thyroid disease    Past Surgical History:  Procedure Laterality Date   GASTRIC BYPASS     THYROID SURGERY     tumors   tumors  breast-benign    SOCIAL HISTORY:  Social History   Socioeconomic History   Marital status: Married    Spouse name: Not on file   Number of children: Not on file   Years of education: 11th grade   Highest education level: Not on file  Occupational History   Occupation: none  Tobacco Use   Smoking status: Every Day    Current packs/day: 0.50    Average packs/day: 0.5 packs/day for 30.0 years (15.0 ttl pk-yrs)    Types: Cigarettes   Smokeless tobacco: Never  Vaping  Use   Vaping status: Never Used  Substance and Sexual Activity   Alcohol use: No   Drug use: No   Sexual activity: Yes  Other Topics Concern   Not on file  Social History Narrative   Not on file   Social Determinants of Health   Financial Resource Strain: Not on file  Food Insecurity: Not on file  Transportation Needs: Not on file  Physical Activity: Not on file  Stress: Not on file  Social Connections: Not on file  Intimate Partner Violence: Not on file    FAMILY HISTORY:  Family History  Problem Relation Age of Onset   Thyroid cancer Mother    Breast cancer Sister    Heart disease Other    Lung disease Other    Cancer Other    Diabetes Other    Colon cancer Neg Hx     CURRENT MEDICATIONS:  Outpatient Encounter Medications as of 11/26/2022  Medication Sig   Budeson-Glycopyrrol-Formoterol (BREZTRI AEROSPHERE) 160-9-4.8 MCG/ACT AERO Take 2 puffs first thing in am and then another 2 puffs about 12 hours later.   cephALEXin (KEFLEX) 500 MG capsule Take 500 mg by mouth 4 (four) times daily.   clindamycin (CLEOCIN) 300 MG capsule Take 1 capsule (300 mg total) by mouth 4 (four) times daily. X 7 days   docusate sodium (COLACE) 100 MG capsule Take 100 mg by mouth daily.   famotidine (PEPCID) 20 MG tablet One after supper   ibuprofen (ADVIL) 200 MG tablet Take 400 mg by mouth every 6 (six) hours as needed for moderate pain.   levothyroxine (SYNTHROID, LEVOTHROID) 175 MCG tablet Take 175 mcg by mouth daily.   omeprazole (PRILOSEC) 40 MG capsule Take 1 capsule (40 mg total) by mouth daily.   ondansetron (ZOFRAN-ODT) 4 MG disintegrating tablet Take 1 tablet (4 mg total) by mouth every 8 (eight) hours as needed for nausea or vomiting.   PHENobarbital (LUMINAL) 97.2 MG tablet Take 97.2 mg by mouth at bedtime.     traMADol (ULTRAM) 50 MG tablet Take tramadol every 6-8 hours for pain that is not relieved by Tylenol   VENTOLIN HFA 108 (90 Base) MCG/ACT inhaler Inhale 2 puffs into the  lungs every 4 (four) hours as needed.   No facility-administered encounter medications on file as of 11/26/2022.    ALLERGIES:  Allergies  Allergen Reactions   Shellfish-Derived Products Anaphylaxis   Nsaids Hives   Penicillins Hives   Sulfa Antibiotics Hives    LABORATORY DATA:  I have reviewed the labs as listed.  CBC    Component Value Date/Time   WBC 6.5 11/19/2022 1320   RBC 4.29 11/19/2022 1320   HGB 14.0 11/19/2022 1320   HCT 42.2 11/19/2022 1320   PLT 257 11/19/2022 1320   MCV 98.4 11/19/2022 1320   MCH 32.6 11/19/2022 1320   MCHC 33.2 11/19/2022 1320   RDW 15.4 11/19/2022 1320   LYMPHSABS 1.5  11/19/2022 1320   MONOABS 0.6 11/19/2022 1320   EOSABS 0.3 11/19/2022 1320   BASOSABS 0.1 11/19/2022 1320      Latest Ref Rng & Units 08/06/2022    3:32 PM 09/18/2021    2:14 PM 12/19/2011    3:07 AM  CMP  Glucose 70 - 99 mg/dL 97  93    BUN 8 - 23 mg/dL 10  18    Creatinine 2.59 - 1.00 mg/dL 5.63  8.75    Sodium 643 - 145 mmol/L 135  139    Potassium 3.5 - 5.1 mmol/L 3.8  3.5    Chloride 98 - 111 mmol/L 110  111    CO2 22 - 32 mmol/L 19  21    Calcium 8.9 - 10.3 mg/dL 8.0  8.3    Total Protein 6.5 - 8.1 g/dL  6.4  6.4   Total Bilirubin 0.3 - 1.2 mg/dL  0.6  0.2   Alkaline Phos 38 - 126 U/L  82  88   AST 15 - 41 U/L  12  16   ALT 0 - 44 U/L  10  13     DIAGNOSTIC IMAGING:  I have independently reviewed the relevant imaging and discussed with the patient.   WRAP UP:  All questions were answered. The patient knows to call the clinic with any problems, questions or concerns.  Medical decision making: ***  Time spent on visit: I spent *** minutes counseling the patient face to face. The total time spent in the appointment was *** minutes and more than 50% was on counseling.  Carnella Guadalajara, PA-C  ***

## 2022-11-26 ENCOUNTER — Inpatient Hospital Stay (HOSPITAL_BASED_OUTPATIENT_CLINIC_OR_DEPARTMENT_OTHER): Payer: Medicaid Other | Admitting: Physician Assistant

## 2022-11-26 ENCOUNTER — Telehealth: Payer: Self-pay

## 2022-11-26 ENCOUNTER — Encounter: Payer: Self-pay | Admitting: Hematology

## 2022-11-26 ENCOUNTER — Other Ambulatory Visit (HOSPITAL_COMMUNITY): Payer: Self-pay

## 2022-11-26 VITALS — BP 153/71 | HR 65 | Temp 96.9°F | Resp 18 | Wt 173.7 lb

## 2022-11-26 DIAGNOSIS — D508 Other iron deficiency anemias: Secondary | ICD-10-CM | POA: Diagnosis not present

## 2022-11-26 DIAGNOSIS — D509 Iron deficiency anemia, unspecified: Secondary | ICD-10-CM | POA: Diagnosis not present

## 2022-11-26 DIAGNOSIS — E538 Deficiency of other specified B group vitamins: Secondary | ICD-10-CM

## 2022-11-26 NOTE — Patient Instructions (Signed)
[facility] at [facility] **VISIT SUMMARY & IMPORTANT INSTRUCTIONS **   You were seen today by Rojelio Brenner PA-C for your iron deficiency.    IRON DEFICIENCY Your iron levels are mildly low, but stable.  You do not need an IV iron infusion at this time, unless you felt that it would improve your energy. We believe the main reason for your low iron levels is due to decreased absorption of iron following your gastric bypass surgery. We will recheck iron levels in 6 months and consider IV iron if your ferritin is <50 or if you become anemic (hemoglobin < 12.0).  VITAMIN B-12 DEFICIENCY I would like you to restart vitamin B12 (1,000 mcg dissolved under your tongue) once a week. We will check your vitamin B12 labs before your next visit.   FOLLOW-UP APPOINTMENT: Labs in 6 months with phone visit 1 week after  ** Thank you for trusting me with your healthcare!  I strive to provide all of my patients with quality care at each visit.  If you receive a survey for this visit, I would be so grateful to you for taking the time to provide feedback.  Thank you in advance!  ~ Miah Boye                   Dr. Doreatha Massed   &   Rojelio Brenner, PA-C   - - - - - - - - - - - - - - - - - -    Thank you for choosing [facility] at [facility] to provide your oncology and hematology care.  To afford each patient quality time with our provider, please arrive at least 15 minutes before your scheduled appointment time.   If you have a lab appointment with the Cancer Center please come in thru the Main Entrance and check in at the main information desk.  You need to re-schedule your appointment should you arrive 10 or more minutes late.  We strive to give you quality time with our providers, and arriving late affects you and other patients whose appointments are after yours.  Also, if you no show three or more times for appointments you may be dismissed  from the clinic at the providers discretion.     Again, thank you for choosing [facility].  Our hope is that these requests will decrease the amount of time that you wait before being seen by our physicians.       _____________________________________________________________  Should you have questions after your visit to [facility], please contact our office at [phone] and follow the prompts.  Our office hours are 8:00 a.m. and 4:30 p.m. Monday - Friday.  Please note that voicemails left after 4:00 p.m. may not be returned until the following business day.  We are closed weekends and major holidays.  You do have access to a nurse 24-7, just call the main number to the clinic [phone] and do not press any options, hold on the line and a nurse will answer the phone.    For prescription refill requests, have your pharmacy contact our office and allow 72 hours.

## 2022-11-26 NOTE — Telephone Encounter (Signed)
*  Pulm  Pharmacy Patient Advocate Encounter   Received notification from CoverMyMeds that prior authorization for Breztri Aerosphere 160-9-4.8MCG/ACT aerosol  is required/requested.   Insurance verification completed.   The patient is insured through [facility] .   Per test claim: PA required; PA started via CoverMyMeds. KEY [id number] . Waiting for clinical questions to populate.

## 2022-11-26 NOTE — Telephone Encounter (Signed)
Prior Authorization form/request asks a question that requires your assistance. Please see the question below and advise accordingly.     Has the patient tried and failed any previous therapies?

## 2022-12-04 ENCOUNTER — Other Ambulatory Visit: Payer: Self-pay

## 2022-12-04 DIAGNOSIS — Z87891 Personal history of nicotine dependence: Secondary | ICD-10-CM

## 2022-12-04 DIAGNOSIS — F17210 Nicotine dependence, cigarettes, uncomplicated: Secondary | ICD-10-CM

## 2022-12-04 DIAGNOSIS — Z122 Encounter for screening for malignant neoplasm of respiratory organs: Secondary | ICD-10-CM

## 2022-12-13 ENCOUNTER — Encounter: Payer: Self-pay | Admitting: Physician Assistant

## 2022-12-13 ENCOUNTER — Ambulatory Visit: Payer: Medicaid Other | Admitting: Physician Assistant

## 2022-12-13 DIAGNOSIS — F17210 Nicotine dependence, cigarettes, uncomplicated: Secondary | ICD-10-CM | POA: Diagnosis not present

## 2022-12-13 NOTE — Progress Notes (Signed)
Virtual Visit via Telephone Note  I connected with Gabriella Hodges on 12/13/22 at  2:30 PM EST by telephone and verified that I am speaking with the correct person using two identifiers.  Location: Patient: Home Provider: Remotely   I discussed the limitations, risks, security and privacy concerns of performing an evaluation and management service by telephone and the availability of in person appointments. I also discussed with the patient that there may be a patient responsible charge related to this service. The patient expressed understanding and agreed to proceed.    Shared Decision Making Visit Lung Cancer Screening Program [phone])   Eligibility: Age 62 y.o. Pack Years Smoking History Calculation: 61 Recent History of coughing up blood: No Unexplained weight loss? No ( >Than 15 pounds within the last 6 months ) Prior History Lung / other cancer: Yes (Thyroid CA in 2015) (Diagnosis within the last 5 years already requiring surveillance chest CT Scans). Smoking Status Current Smoker  Visit Components:     Discussion included one or more decision making aids. Yes Discussion included risk/benefits of screening. Yes Discussion included potential follow up diagnostic testing for abnormal scans. Yes Discussion included meaning and risk of Over Diagnosis. Yes Discussion included meaning and risk of False Positives. Yes Discussion included meaning of total radiation exposure. Yes  Counseling Included: Importance of adherence to annual lung cancer LDCT screening. Yes Impact of comorbidities on ability to participate in the program. Yes Ability and willingness to under diagnostic treatment. Yes  Smoking Cessation Counseling: Current Smokers:  Discussed importance of smoking cessation. Yes Information about tobacco cessation classes and interventions provided to patient. Yes Symptomatic Patient. No  Counseling: NA Diagnosis Code: Tobacco Use Z72.0 Asymptomatic Patient  Yes   Counseling (Intermediate counseling: > three minutes counseling) [id number]   Rutherford Guys, PA-C

## 2022-12-13 NOTE — Patient Instructions (Signed)

## 2022-12-18 ENCOUNTER — Ambulatory Visit (HOSPITAL_COMMUNITY)
Admission: RE | Admit: 2022-12-18 | Discharge: 2022-12-18 | Disposition: A | Discharge status: Home / Self Care | Payer: Medicaid Other | Source: Ambulatory Visit | Attending: Internal Medicine | Admitting: Internal Medicine

## 2022-12-18 DIAGNOSIS — Z122 Encounter for screening for malignant neoplasm of respiratory organs: Secondary | ICD-10-CM | POA: Diagnosis present

## 2022-12-18 DIAGNOSIS — Z87891 Personal history of nicotine dependence: Secondary | ICD-10-CM | POA: Diagnosis present

## 2022-12-18 DIAGNOSIS — F17210 Nicotine dependence, cigarettes, uncomplicated: Secondary | ICD-10-CM | POA: Insufficient documentation

## 2022-12-21 ENCOUNTER — Other Ambulatory Visit (HOSPITAL_COMMUNITY): Payer: Self-pay | Admitting: Family Medicine

## 2022-12-21 ENCOUNTER — Ambulatory Visit (HOSPITAL_COMMUNITY)
Admission: RE | Admit: 2022-12-21 | Discharge: 2022-12-21 | Disposition: A | Discharge status: Home / Self Care | Payer: Medicaid Other | Source: Ambulatory Visit | Attending: Family Medicine | Admitting: Family Medicine

## 2022-12-21 DIAGNOSIS — R52 Pain, unspecified: Secondary | ICD-10-CM | POA: Diagnosis present

## 2022-12-31 ENCOUNTER — Other Ambulatory Visit: Payer: Self-pay

## 2022-12-31 DIAGNOSIS — F17210 Nicotine dependence, cigarettes, uncomplicated: Secondary | ICD-10-CM

## 2022-12-31 DIAGNOSIS — Z122 Encounter for screening for malignant neoplasm of respiratory organs: Secondary | ICD-10-CM

## 2022-12-31 DIAGNOSIS — Z87891 Personal history of nicotine dependence: Secondary | ICD-10-CM

## 2023-01-15 ENCOUNTER — Ambulatory Visit (HOSPITAL_COMMUNITY): Admission: RE | Admit: 2023-01-15 | Payer: Medicaid Other | Source: Ambulatory Visit

## 2023-02-17 DIAGNOSIS — G40909 Epilepsy, unspecified, not intractable, without status epilepticus: Secondary | ICD-10-CM | POA: Diagnosis present

## 2023-02-17 DIAGNOSIS — Z880 Allergy status to penicillin: Secondary | ICD-10-CM

## 2023-02-17 DIAGNOSIS — F419 Anxiety disorder, unspecified: Secondary | ICD-10-CM | POA: Diagnosis present

## 2023-02-17 DIAGNOSIS — Z9181 History of falling: Secondary | ICD-10-CM

## 2023-02-17 DIAGNOSIS — E876 Hypokalemia: Secondary | ICD-10-CM | POA: Diagnosis present

## 2023-02-17 DIAGNOSIS — J101 Influenza due to other identified influenza virus with other respiratory manifestations: Principal | ICD-10-CM | POA: Diagnosis present

## 2023-02-17 DIAGNOSIS — K219 Gastro-esophageal reflux disease without esophagitis: Secondary | ICD-10-CM | POA: Diagnosis present

## 2023-02-17 DIAGNOSIS — Z91013 Allergy to seafood: Secondary | ICD-10-CM

## 2023-02-17 DIAGNOSIS — E039 Hypothyroidism, unspecified: Secondary | ICD-10-CM | POA: Diagnosis present

## 2023-02-17 DIAGNOSIS — Z79890 Hormone replacement therapy (postmenopausal): Secondary | ICD-10-CM

## 2023-02-17 DIAGNOSIS — N390 Urinary tract infection, site not specified: Secondary | ICD-10-CM | POA: Diagnosis present

## 2023-02-17 DIAGNOSIS — Z9884 Bariatric surgery status: Secondary | ICD-10-CM

## 2023-02-17 DIAGNOSIS — Z882 Allergy status to sulfonamides status: Secondary | ICD-10-CM

## 2023-02-17 DIAGNOSIS — Z886 Allergy status to analgesic agent status: Secondary | ICD-10-CM

## 2023-02-17 DIAGNOSIS — Z1152 Encounter for screening for COVID-19: Secondary | ICD-10-CM

## 2023-02-17 DIAGNOSIS — Z833 Family history of diabetes mellitus: Secondary | ICD-10-CM

## 2023-02-17 DIAGNOSIS — E872 Acidosis, unspecified: Secondary | ICD-10-CM | POA: Diagnosis present

## 2023-02-17 DIAGNOSIS — J441 Chronic obstructive pulmonary disease with (acute) exacerbation: Secondary | ICD-10-CM | POA: Diagnosis present

## 2023-02-17 DIAGNOSIS — D509 Iron deficiency anemia, unspecified: Secondary | ICD-10-CM | POA: Diagnosis present

## 2023-02-17 DIAGNOSIS — F17210 Nicotine dependence, cigarettes, uncomplicated: Secondary | ICD-10-CM | POA: Diagnosis present

## 2023-02-18 ENCOUNTER — Emergency Department (HOSPITAL_COMMUNITY): Payer: Medicaid Other

## 2023-02-18 ENCOUNTER — Inpatient Hospital Stay (HOSPITAL_COMMUNITY)
Admission: EM | Admit: 2023-02-18 | Discharge: 2023-02-19 | DRG: 194 | Disposition: A | Discharge status: Home / Self Care | Payer: Medicaid Other | Attending: Internal Medicine | Admitting: Internal Medicine

## 2023-02-18 ENCOUNTER — Encounter (HOSPITAL_COMMUNITY): Payer: Self-pay | Admitting: Emergency Medicine

## 2023-02-18 ENCOUNTER — Other Ambulatory Visit: Payer: Self-pay

## 2023-02-18 DIAGNOSIS — Z91013 Allergy to seafood: Secondary | ICD-10-CM | POA: Diagnosis not present

## 2023-02-18 DIAGNOSIS — Z79890 Hormone replacement therapy (postmenopausal): Secondary | ICD-10-CM | POA: Diagnosis not present

## 2023-02-18 DIAGNOSIS — Z882 Allergy status to sulfonamides status: Secondary | ICD-10-CM | POA: Diagnosis not present

## 2023-02-18 DIAGNOSIS — N390 Urinary tract infection, site not specified: Secondary | ICD-10-CM | POA: Diagnosis present

## 2023-02-18 DIAGNOSIS — Z9181 History of falling: Secondary | ICD-10-CM | POA: Diagnosis not present

## 2023-02-18 DIAGNOSIS — N3000 Acute cystitis without hematuria: Secondary | ICD-10-CM

## 2023-02-18 DIAGNOSIS — Z1152 Encounter for screening for COVID-19: Secondary | ICD-10-CM | POA: Diagnosis not present

## 2023-02-18 DIAGNOSIS — E039 Hypothyroidism, unspecified: Secondary | ICD-10-CM | POA: Diagnosis present

## 2023-02-18 DIAGNOSIS — J101 Influenza due to other identified influenza virus with other respiratory manifestations: Secondary | ICD-10-CM | POA: Diagnosis present

## 2023-02-18 DIAGNOSIS — Z833 Family history of diabetes mellitus: Secondary | ICD-10-CM | POA: Diagnosis not present

## 2023-02-18 DIAGNOSIS — J441 Chronic obstructive pulmonary disease with (acute) exacerbation: Secondary | ICD-10-CM | POA: Diagnosis present

## 2023-02-18 DIAGNOSIS — K219 Gastro-esophageal reflux disease without esophagitis: Secondary | ICD-10-CM | POA: Diagnosis present

## 2023-02-18 DIAGNOSIS — G40909 Epilepsy, unspecified, not intractable, without status epilepticus: Secondary | ICD-10-CM | POA: Diagnosis present

## 2023-02-18 DIAGNOSIS — Z880 Allergy status to penicillin: Secondary | ICD-10-CM | POA: Diagnosis not present

## 2023-02-18 DIAGNOSIS — F17210 Nicotine dependence, cigarettes, uncomplicated: Secondary | ICD-10-CM | POA: Diagnosis present

## 2023-02-18 DIAGNOSIS — Z886 Allergy status to analgesic agent status: Secondary | ICD-10-CM | POA: Diagnosis not present

## 2023-02-18 DIAGNOSIS — F419 Anxiety disorder, unspecified: Secondary | ICD-10-CM | POA: Diagnosis present

## 2023-02-18 DIAGNOSIS — E876 Hypokalemia: Secondary | ICD-10-CM

## 2023-02-18 DIAGNOSIS — E872 Acidosis, unspecified: Secondary | ICD-10-CM | POA: Diagnosis present

## 2023-02-18 DIAGNOSIS — D509 Iron deficiency anemia, unspecified: Secondary | ICD-10-CM | POA: Diagnosis present

## 2023-02-18 DIAGNOSIS — Z9884 Bariatric surgery status: Secondary | ICD-10-CM | POA: Diagnosis not present

## 2023-02-18 LAB — BASIC METABOLIC PANEL
Anion gap: 8 (ref 5–15)
Anion gap: 10 (ref 5–15)
BUN: 11 mg/dL (ref 8–23)
BUN: 11 mg/dL (ref 8–23)
CO2: 18 mmol/L — ABNORMAL LOW (ref 22–32)
CO2: 16 mmol/L — ABNORMAL LOW (ref 22–32)
Calcium: 7.9 mg/dL — ABNORMAL LOW (ref 8.9–10.3)
Calcium: 7.9 mg/dL — ABNORMAL LOW (ref 8.9–10.3)
Chloride: 109 mmol/L (ref 98–111)
Chloride: 109 mmol/L (ref 98–111)
Creatinine, Ser: 0.69 mg/dL (ref 0.44–1.00)
Creatinine, Ser: 0.6 mg/dL (ref 0.44–1.00)
GFR, Estimated: >60 mL/min (ref >60)
GFR, Estimated: >60 mL/min (ref >60)
Glucose, Bld: 109 mg/dL — ABNORMAL HIGH (ref 70–99)
Glucose, Bld: 93 mg/dL (ref 70–99)
Potassium: 2.2 mmol/L — CL (ref 3.5–5.1)
Potassium: 3.3 mmol/L — ABNORMAL LOW (ref 3.5–5.1)
Sodium: 135 mmol/L (ref 135–145)
Sodium: 135 mmol/L (ref 135–145)

## 2023-02-18 LAB — URINALYSIS, ROUTINE W REFLEX MICROSCOPIC
Bacteria, UA: NONE SEEN
Bilirubin Urine: NEGATIVE
Glucose, UA: NEGATIVE mg/dL
Ketones, ur: NEGATIVE mg/dL
Nitrite: POSITIVE — AB
Protein, ur: 100 mg/dL — AB
RBC / HPF: >50 RBC/hpf (ref 0–5)
Specific Gravity, Urine: 1.017 (ref 1.005–1.030)
WBC, UA: >50 WBC/hpf (ref 0–5)
pH: 6 (ref 5.0–8.0)

## 2023-02-18 LAB — CBC
HCT: 39.5 % (ref 36.0–46.0)
HCT: 40.3 % (ref 36.0–46.0)
Hemoglobin: 13.5 g/dL (ref 12.0–15.0)
Hemoglobin: 13.9 g/dL (ref 12.0–15.0)
MCH: 31.5 pg (ref 26.0–34.0)
MCH: 32.6 pg (ref 26.0–34.0)
MCHC: 33.5 g/dL (ref 30.0–36.0)
MCHC: 35.2 g/dL (ref 30.0–36.0)
MCV: 92.7 fL (ref 80.0–100.0)
MCV: 93.9 fL (ref 80.0–100.0)
Platelets: 189 10*3/uL (ref 150–400)
Platelets: 194 10*3/uL (ref 150–400)
RBC: 4.26 MIL/uL (ref 3.87–5.11)
RBC: 4.29 MIL/uL (ref 3.87–5.11)
RDW: 14 % (ref 11.5–15.5)
RDW: 14.1 % (ref 11.5–15.5)
WBC: 7.6 10*3/uL (ref 4.0–10.5)
WBC: 8 10*3/uL (ref 4.0–10.5)
nRBC: 0 % (ref 0.0–0.2)
nRBC: 0 % (ref 0.0–0.2)

## 2023-02-18 LAB — SEDIMENTATION RATE: Sed Rate: 16 mm/h (ref 0–22)

## 2023-02-18 LAB — COMPREHENSIVE METABOLIC PANEL
ALT: 9 U/L (ref 0–44)
AST: 11 U/L — ABNORMAL LOW (ref 15–41)
Albumin: 3.2 g/dL — ABNORMAL LOW (ref 3.5–5.0)
Alkaline Phosphatase: 87 U/L (ref 38–126)
Anion gap: 8 (ref 5–15)
BUN: 11 mg/dL (ref 8–23)
CO2: 20 mmol/L — ABNORMAL LOW (ref 22–32)
Calcium: 7.9 mg/dL — ABNORMAL LOW (ref 8.9–10.3)
Chloride: 109 mmol/L (ref 98–111)
Creatinine, Ser: 0.71 mg/dL (ref 0.44–1.00)
GFR, Estimated: >60 mL/min (ref >60)
Glucose, Bld: 96 mg/dL (ref 70–99)
Potassium: 2.3 mmol/L — CL (ref 3.5–5.1)
Sodium: 137 mmol/L (ref 135–145)
Total Bilirubin: 0.4 mg/dL (ref 0.0–1.2)
Total Protein: 5.8 g/dL — ABNORMAL LOW (ref 6.5–8.1)

## 2023-02-18 LAB — TROPONIN I (HIGH SENSITIVITY)
Troponin I (High Sensitivity): 8 ng/L (ref <18)
Troponin I (High Sensitivity): 8 ng/L (ref <18)

## 2023-02-18 LAB — RESP PANEL BY RT-PCR (RSV, FLU A&B, COVID)  RVPGX2
Influenza A by PCR: POSITIVE — AB
Influenza B by PCR: NEGATIVE
Resp Syncytial Virus by PCR: NEGATIVE
SARS Coronavirus 2 by RT PCR: NEGATIVE

## 2023-02-18 LAB — PHENOBARBITAL LEVEL: Phenobarbital: 10.1 ug/mL — ABNORMAL LOW (ref 15.0–40.0)

## 2023-02-18 LAB — PROCALCITONIN: Procalcitonin: <0.1 ng/mL

## 2023-02-18 LAB — C-REACTIVE PROTEIN: CRP: 11.1 mg/dL — ABNORMAL HIGH (ref <1.0)

## 2023-02-18 LAB — MAGNESIUM: Magnesium: 1.9 mg/dL (ref 1.7–2.4)

## 2023-02-18 MED ORDER — ACETAMINOPHEN 650 MG RE SUPP: 650.0000 mg | Freq: Four times a day (QID) | RECTAL | Status: DC | PRN

## 2023-02-18 MED ORDER — SODIUM CHLORIDE 0.9 % IV SOLN
2.0000 g | Freq: Once | INTRAVENOUS | Status: AC
  Administered 2023-02-18: 2 g via INTRAVENOUS
  Filled 2023-02-18: qty 20

## 2023-02-18 MED ORDER — ONDANSETRON HCL 4 MG/2ML IJ SOLN: 4.0000 mg | Freq: Four times a day (QID) | INTRAMUSCULAR | Status: DC | PRN

## 2023-02-18 MED ORDER — ALBUTEROL SULFATE (2.5 MG/3ML) 0.083% IN NEBU: 2.5000 mg | INHALATION_SOLUTION | RESPIRATORY_TRACT | Status: DC | PRN

## 2023-02-18 MED ORDER — IPRATROPIUM-ALBUTEROL 0.5-2.5 (3) MG/3ML IN SOLN
3.0000 mL | Freq: Four times a day (QID) | RESPIRATORY_TRACT | Status: DC
  Administered 2023-02-18 – 2023-02-19 (×4): 3 mL via RESPIRATORY_TRACT
  Filled 2023-02-18 (×5): qty 3

## 2023-02-18 MED ORDER — SODIUM CHLORIDE 0.9% FLUSH
3.0000 mL | Freq: Two times a day (BID) | INTRAVENOUS | Status: DC
  Administered 2023-02-18: 3 mL via INTRAVENOUS

## 2023-02-18 MED ORDER — OSELTAMIVIR PHOSPHATE 75 MG PO CAPS
75.0000 mg | ORAL_CAPSULE | Freq: Two times a day (BID) | ORAL | Status: DC
  Administered 2023-02-18 – 2023-02-19 (×3): 75 mg via ORAL
  Filled 2023-02-18 (×3): qty 1

## 2023-02-18 MED ORDER — PANTOPRAZOLE SODIUM 40 MG PO TBEC
40.0000 mg | DELAYED_RELEASE_TABLET | Freq: Every day | ORAL | Status: DC
  Administered 2023-02-18 – 2023-02-19 (×2): 40 mg via ORAL
  Filled 2023-02-18 (×2): qty 1

## 2023-02-18 MED ORDER — MAGNESIUM SULFATE 2 GM/50ML IV SOLN
2.0000 g | Freq: Once | INTRAVENOUS | Status: AC
Start: 2023-02-18 — End: 2023-02-18
  Administered 2023-02-18: 2 g via INTRAVENOUS
  Filled 2023-02-18: qty 50

## 2023-02-18 MED ORDER — ACETAMINOPHEN 500 MG PO TABS
ORAL_TABLET | ORAL | Status: AC
Start: 2023-02-18 — End: 2023-02-18
  Administered 2023-02-18: 1000 mg via ORAL
  Filled 2023-02-18: qty 2

## 2023-02-18 MED ORDER — LEVOTHYROXINE SODIUM 75 MCG PO TABS
175.0000 ug | ORAL_TABLET | Freq: Every day | ORAL | Status: DC
  Administered 2023-02-18 – 2023-02-19 (×2): 175 ug via ORAL
  Filled 2023-02-18: qty 1
  Filled 2023-02-18: qty 4
  Filled 2023-02-18: qty 1

## 2023-02-18 MED ORDER — POTASSIUM CHLORIDE 10 MEQ/100ML IV SOLN
10.0000 meq | INTRAVENOUS | Status: AC
  Administered 2023-02-18 (×3): 10 meq via INTRAVENOUS
  Filled 2023-02-18 (×3): qty 100

## 2023-02-18 MED ORDER — ONDANSETRON HCL 4 MG PO TABS: 4.0000 mg | ORAL_TABLET | Freq: Four times a day (QID) | ORAL | Status: DC | PRN

## 2023-02-18 MED ORDER — LACTATED RINGERS IV SOLN
INTRAVENOUS | Status: AC
Start: 2023-02-18 — End: 2023-02-18

## 2023-02-18 MED ORDER — POTASSIUM CHLORIDE CRYS ER 20 MEQ PO TBCR
40.0000 meq | EXTENDED_RELEASE_TABLET | Freq: Once | ORAL | Status: AC
  Administered 2023-02-18: 40 meq via ORAL
  Filled 2023-02-18: qty 2

## 2023-02-18 MED ORDER — ENOXAPARIN SODIUM 40 MG/0.4ML IJ SOSY
40.0000 mg | PREFILLED_SYRINGE | INTRAMUSCULAR | Status: DC
  Administered 2023-02-18: 40 mg via SUBCUTANEOUS

## 2023-02-18 MED ORDER — SODIUM CHLORIDE 0.9 % IV SOLN
1.0000 g | INTRAVENOUS | Status: DC
  Administered 2023-02-19: 1 g via INTRAVENOUS
  Filled 2023-02-18: qty 10

## 2023-02-18 MED ORDER — ENOXAPARIN SODIUM 40 MG/0.4ML IJ SOSY: 40.0000 mg | PREFILLED_SYRINGE | INTRAMUSCULAR | Status: DC

## 2023-02-18 MED ORDER — BUDESONIDE 0.25 MG/2ML IN SUSP
0.2500 mg | Freq: Two times a day (BID) | RESPIRATORY_TRACT | Status: DC
  Administered 2023-02-18 – 2023-02-19 (×2): 0.25 mg via RESPIRATORY_TRACT
  Filled 2023-02-18 (×3): qty 2

## 2023-02-18 MED ORDER — PHENOBARBITAL 97.2 MG PO TABS
97.2000 mg | ORAL_TABLET | Freq: Every day | ORAL | Status: DC
  Administered 2023-02-18: 97.2 mg via ORAL
  Filled 2023-02-18: qty 1

## 2023-02-18 MED ORDER — ACETAMINOPHEN 325 MG PO TABS: 650.0000 mg | ORAL_TABLET | Freq: Four times a day (QID) | ORAL | Status: DC | PRN

## 2023-02-18 NOTE — H&P (Signed)
 History and Physical    Patient: Gabriella Hodges:[id number] DOB: Nov 12, 1960 DOA: 02/18/2023 DOS: the patient was seen and examined on 02/18/2023 PCP: Minus Amel, MD  Patient coming from: Home Medical readiness/disposition: Anticipate patient will be medically ready by 02/20/2023.  Patient will return to previous home environment.    Chief Complaint:  Chief Complaint  Patient presents with   Sore Throat   Headache   Chest Pain   HPI: Gabriella Hodges is a 63 y.o. female with medical history significant of vertigo, seizure disorder, iron  deficiency anemia, COPD, tobacco usage, and hypothyroidism.Patient presented to the ED on 2/8 with complaints of headache, sore throat, productive cough with green sputum for several days.  She has also had dysuria which apparently is a chronic problem but has no history of recurrent UTI.  She was also having intermittent hematuria.  She has pleuritic chest pain especially when coughing.    At presentation to the ED she had a temperature of 100.6, she was minimally hypertensive, she was in sinus rhythm and room air sats were 97%.  She later had a Tmax of 102.8 F orally.  Her potassium was low at 2.2 with a CO2 of 18, renal function is normal, troponin was normal, white count was normal.  Viral panel was positive for influenza A, urinalysis was markedly abnormal with a large amount of hemoglobin, turbid appearance, moderate leukocytes, positive nitrite, 100 protein, greater than 50 WBCs.  Her family had also been reporting that she was confused which was new for her.  Noncontrasted CT of the head was unremarkable.  Incidental issues regarding difficulty walking on the right leg since a fall in November.  X-rays of the right lower extremity were unremarkable.  Chest x-ray without any focal infiltrates.  On exam though she was having significant wheezing and paroxysmal coughing.  Although was not hypoxic was dyspneic with ambulation.  Hospitalist service has been  asked to evaluate the patient for admission regarding influenza symptoms.  She has been treated with IV magnesium  as well for her respiratory symptoms.  Upon my evaluation of the patient she confirms same history.  She states she has not had a flu shot.  She has not smoked in 24 hours and typically smokes a half a pack per day.  She no longer takes Breztri  because it apparently made her nervous.  her pulmonologist is Dr. Waymond Hailey.  No other additions to above history of present illness.   Review of Systems: As mentioned in the history of present illness. All other systems reviewed and are negative.     Past Medical History:  Diagnosis Date   Anxiety    Epilepsy (HCC)    GERD (gastroesophageal reflux disease)    Hypothyroidism    Thyroid  disease    Past Surgical History:  Procedure Laterality Date   GASTRIC BYPASS     THYROID  SURGERY     tumors   tumors     breast-benign   Social History:  reports that she has been smoking cigarettes. She has a 15 pack-year smoking history. She has never used smokeless tobacco. She reports that she does not drink alcohol and does not use drugs.  Allergies  Allergen Reactions   Shellfish-Derived Products Anaphylaxis   Nsaids Hives   Penicillins Hives   Sulfa Antibiotics Hives    Family History  Problem Relation Age of Onset   Thyroid  cancer Mother    Breast cancer Sister    Heart disease Other    Lung  disease Other    Cancer Other    Diabetes Other    Colon cancer Neg Hx     Prior to Admission medications   Medication Sig Start Date End Date Taking? Authorizing Provider  Budeson-Glycopyrrol-Formoterol (BREZTRI  AEROSPHERE) 160-9-4.8 MCG/ACT AERO Take 2 puffs first thing in am and then another 2 puffs about 12 hours later. 11/20/22   Diamond Formica, MD  docusate sodium  (COLACE) 100 MG capsule Take 100 mg by mouth daily.    [provider]  famotidine  (PEPCID ) 20 MG tablet One after supper 11/20/22   Wert, Michael B, MD  ibuprofen  (ADVIL) 200 MG tablet Take 400 mg by mouth every 6 (six) hours as needed for moderate pain.    [provider]  levothyroxine  (SYNTHROID , LEVOTHROID) 175 MCG tablet Take 175 mcg by mouth daily.    [provider]  omeprazole  (PRILOSEC) 40 MG capsule Take 1 capsule (40 mg total) by mouth daily. 12/20/11   Marilee Showers, MD  ondansetron  (ZOFRAN -ODT) 4 MG disintegrating tablet Take 1 tablet (4 mg total) by mouth every 8 (eight) hours as needed for nausea or vomiting. 11/21/21   Sonnie Dusky, PA-C  PHENobarbital  (LUMINAL) 97.2 MG tablet Take 97.2 mg by mouth at bedtime.      [provider]  traMADol  (ULTRAM ) 50 MG tablet Take tramadol  every 6-8 hours for pain that is not relieved by Tylenol  09/25/22   Cheyenne Cotta, MD  VENTOLIN  HFA 108 (90 Base) MCG/ACT inhaler Inhale 2 puffs into the lungs every 4 (four) hours as needed. 11/07/22   [provider]    Physical Exam: Vitals:   02/18/23 0330 02/18/23 0400 02/18/23 0423 02/18/23 0530  BP: (!) 142/66 (!) 145/67    Pulse: 75 77    Resp: (!) 24 (!) 26    Temp:   (!) 102.8 F (39.3 C) (!) 101.3 F (38.5 C)  TempSrc:    Oral  SpO2: 95% 95%    Weight:      Height:       Constitutional: NAD, calm, comfortable Respiratory: Breath at rest patient with diffuse expiratory crackles been increased with repositioning in the bed.  Repositioning also elicits paroxysmal coughing and production of green sputum.  Normal respiratory effort. No accessory muscle use.  O2 sats 97 to 98% on room air Cardiovascular: Regular rate and rhythm, no murmurs / rubs / gallops. No extremity edema. 2+ pedal pulses.  Abdomen: no tenderness, no masses palpated. No hepatosplenomegaly. Bowel sounds positive.  Musculoskeletal: no clubbing / cyanosis. No joint deformity upper and lower extremities. Good ROM, no contractures. Normal muscle tone.  Skin: no rashes, lesions, ulcers. No induration Neurologic: CN 2-12 grossly intact.  Sensation intact, DTR normal. Strength 5/5 x all 4 extremities.  Psychiatric: Normal judgment and insight. Alert and oriented x 3. Normal mood.   Data Reviewed:  Sodium 135, potassium 2.2.  Patient was given both oral and IV potassium with repeat potassium only increasing to 2.3.  CO2 18, glucose 109, BUN 11, creatinine 0.69, calcium 7.9, anion gap normal  Troponin 8 and 8  WBC 8000 differential not obtained, hemoglobin 13.9, platelets 194,000  PCR positive for influenza A  UA consistent with UTI  CT head/chest x-ray as above  Assessment and Plan: Acute COPD exacerbation secondary to influenza A Presented with 3 days history of progressive respiratory symptoms, fevers, malaise, sore throat and cough with testing positive for influenza A Supportive care for COPD exacerbation: Scheduled DuoNebs, as needed albuterol   nebs for breakthrough wheezing and shortness of breath, scheduled budesonide  nebs Flutter valve and early mobilization Tamiflu  Check CRP, ESR and procalcitonin level-procalcitonin level likely to be elevated with UTI but will be helpful in distinguishing from viral versus potential early bacterial pneumonia or bronchitis symptoms.  Patient had only been sick for about 24 hours which makes it less likely she would have pulmonary bacterial growth.  Abnormal urinalysis concerning for UTI Follow-up on urine culture Continue IV Rocephin   Acute hypokalemia with mild non-anion gap acidemia Etiology unclear Patient did not use multiple doses of her albuterol  back-to-back prior to admission.  She has not had any diarrhea or nausea/vomiting. Magnesium  level was normal Repeat electrolyte panel at 12 PM today and continue to replace potassium as needed Telemetry monitoring If potassium remains less than 3.0 may need to begin a more in-depth evaluation of hypokalemia  Seizure disorder Given altered mentation family was concerned that patient may have had a seizure but no seizure  activity witnessed Check phenobarbital  level CT head unremarkable Continue home dose phenobarbital  Suspect acute viral illness and fever/UTI contributing to recent altered mentation  Tobacco abuse Last cigarette 24 hours ago and smokes about half a pack per day Counseled regarding cessation  Iron  deficiency anemia Hemoglobin stable at 13.9  Hypothyroidism Continue home Synthroid    Advance Care Planning:   Code Status: Full Code   VTE prophylaxis: Lovenox   Consults: None  Family Communication: Patient only  Severity of Illness: The appropriate patient status for this patient is INPATIENT. Inpatient status is judged to be reasonable and necessary in order to provide the required intensity of service to ensure the patient's safety. The patient's presenting symptoms, physical exam findings, and initial radiographic and laboratory data in the context of their chronic comorbidities is felt to place them at high risk for further clinical deterioration. Furthermore, it is not anticipated that the patient will be medically stable for discharge from the hospital within 2 midnights of admission.   * I certify that at the point of admission it is my clinical judgment that the patient will require inpatient hospital care spanning beyond 2 midnights from the point of admission due to high intensity of service, high risk for further deterioration and high frequency of surveillance required.*  Author: Kathye Parkin, NP 02/18/2023 6:52 AM  For on call review [email or web address].

## 2023-02-18 NOTE — ED Provider Notes (Signed)
 [facility] Emergency Department Provider Note MRN:  [id number]  Arrival date & time: 02/18/23     Chief Complaint   Sore Throat, Headache, and Chest Pain   History of Present Illness   Gabriella Hodges is a 63 y.o. year-old female with a history of epilepsy presenting to the ED with chief complaint of sore throat.  Patient complaining of recent headache, cough, chest discomfort with cough, sore throat.  Family concerned because they went to visit her and she was acting a bit confused, described it as as if she were postictal.  No witnessed seizure activity.  Patient has been having issues walking on her right leg ever since a fall back in November.  Denies numbness or weakness to the arms or legs, no abdominal pain.  Review of Systems  A thorough review of systems was obtained and all systems are negative except as noted in the HPI and PMH.   Patient's Health History    Past Medical History:  Diagnosis Date   Anxiety    Epilepsy (HCC)    GERD (gastroesophageal reflux disease)    Hypothyroidism    Thyroid  disease     Past Surgical History:  Procedure Laterality Date   GASTRIC BYPASS     THYROID  SURGERY     tumors   tumors     breast-benign    Family History  Problem Relation Age of Onset   Thyroid  cancer Mother    Breast cancer Sister    Heart disease Other    Lung disease Other    Cancer Other    Diabetes Other    Colon cancer Neg Hx     Social History   Socioeconomic History   Marital status: Married    Spouse name: Not on file   Number of children: Not on file   Years of education: 11th grade   Highest education level: Not on file  Occupational History   Occupation: none  Tobacco Use   Smoking status: Every Day    Current packs/day: 0.50    Average packs/day: 0.5 packs/day for 30.0 years (15.0 ttl pk-yrs)    Types: Cigarettes   Smokeless tobacco: Never  Vaping Use   Vaping status: Never Used  Substance and Sexual Activity    Alcohol use: No   Drug use: No   Sexual activity: Yes  Other Topics Concern   Not on file  Social History Narrative   Not on file   Social Drivers of Health   Financial Resource Strain: Not on file  Food Insecurity: Not on file  Transportation Needs: Not on file  Physical Activity: Not on file  Stress: Not on file  Social Connections: Not on file  Intimate Partner Violence: Not on file     Physical Exam   Vitals:   02/18/23 0135 02/18/23 0145  BP: (!) 146/63 137/72  Pulse: 68 67  Resp: (!) 23 19  Temp:    SpO2: 98% 96%    CONSTITUTIONAL: Well-appearing, NAD NEURO/PSYCH:  Alert and oriented x 3, normal and symmetric strength and sensation, normal coordination, normal speech, antalgic gait EYES:  eyes equal and reactive ENT/NECK:  no LAD, no JVD CARDIO: Regular rate, well-perfused, normal S1 and S2 PULM:  CTAB no wheezing or rhonchi GI/GU:  non-distended, non-tender MSK/SPINE:  No gross deformities, no edema SKIN:  no rash, atraumatic   *Additional and/or pertinent findings included in MDM below  Diagnostic and Interventional Summary    EKG Interpretation Date/Time:  Monday February 18 2023 00:19:56 EST Ventricular Rate:  75 PR Interval:  174 QRS Duration:  97 QT Interval:  462 QTC Calculation: 517 R Axis:   34  Text Interpretation: Sinus rhythm Borderline T abnormalities, diffuse leads Prolonged QT interval Confirmed by Gwenetta Lennert [phone]) on 02/18/2023 1:55:36 AM       Labs Reviewed  RESP PANEL BY RT-PCR (RSV, FLU A&B, COVID)  RVPGX2 - Abnormal; Notable for the following components:      Result Value   Influenza A by PCR POSITIVE (*)    All other components within normal limits  URINALYSIS, ROUTINE W REFLEX MICROSCOPIC - Abnormal; Notable for the following components:   APPearance TURBID (*)    Hgb urine dipstick LARGE (*)    Protein, ur 100 (*)    Nitrite POSITIVE (*)    Leukocytes,Ua MODERATE (*)    All other components within normal limits   BASIC METABOLIC PANEL - Abnormal; Notable for the following components:   Potassium 2.2 (*)    CO2 18 (*)    Glucose, Bld 109 (*)    Calcium 7.9 (*)    All other components within normal limits  CBC  TROPONIN I (HIGH SENSITIVITY)  TROPONIN I (HIGH SENSITIVITY)    DG Chest Portable 1 View  Final Result    CT HEAD WO CONTRAST ( )    (Results Pending)  DG FEMUR, MIN 2 VIEWS RIGHT    (Results Pending)  DG Hip Unilat W or Wo Pelvis 2-3 Views Right    (Results Pending)    Medications  potassium chloride  10 mEq in 100 mL IVPB (10 mEq Intravenous New Bag/Given 02/18/23 0143)  magnesium  sulfate IVPB 2 g 50 mL (has no administration in time range)  cefTRIAXone  (ROCEPHIN ) 2 g in sodium chloride  0.9 % 100 mL IVPB (has no administration in time range)  potassium chloride  SA (KLOR-CON  M) CR tablet 40 mEq (40 mEq Oral Given 02/18/23 0143)     Procedures  /  Critical Care .Critical Care  Performed by: Edson Graces, MD Authorized by: Edson Graces, MD   Critical care provider statement:    Critical care time (minutes):  45   Critical care was necessary to treat or prevent imminent or life-threatening deterioration of the following conditions: Critical hypokalemia.   Critical care was time spent personally by me on the following activities:  Development of treatment plan with patient or surrogate, discussions with consultants, evaluation of patient's response to treatment, examination of patient, ordering and review of laboratory studies, ordering and review of radiographic studies, ordering and performing treatments and interventions, pulse oximetry, re-evaluation of patient's condition and review of old charts   ED Course and Medical Decision Making  Initial Impression and Ddx Patient presenting with a confused state, seems clear and baseline at this time with no focal neurological deficits.  She arrives febrile with upper respiratory infectious symptoms, suspicious for flu.  Given the  leg symptoms and gait issues and confusion, stroke versus CNS mass or lesion also considered.  Past medical/surgical history that increases complexity of ED encounter: Epilepsy  Interpretation of Diagnostics I personally reviewed the EKG and my interpretation is as follows: Sinus rhythm without concerning findings  Labs notable for critical hypokalemia, patient is also flu positive.  Urinalysis with evidence to suggest infection.  Patient Reassessment and Ultimate Disposition/Management     Replating potassium, plan is for admission.  Patient management required discussion with the following services or consulting groups:  Hospitalist Service  Complexity of Problems Addressed Acute illness or injury that poses threat of life of bodily function  Additional Data Reviewed and Analyzed Further history obtained from: Further history from spouse/family member  Additional Factors Impacting ED Encounter Risk Consideration of hospitalization  Merrick Abe. Harless Lien, MD [facility] [email or web address]  Final Clinical Impressions(s) / ED Diagnoses     ICD-10-CM   1. Influenza A  J10.1     2. Hypokalemia  E87.6       ED Discharge Orders     None        Discharge Instructions Discussed with and Provided to Patient:   Discharge Instructions   None      Edson Graces, MD 02/18/23 [phone]

## 2023-02-18 NOTE — Progress Notes (Deleted)
   Gabriella Hodges, female    DOB: [birth year]    MRN: [id number]   Brief patient profile:  58  yowf  active smoker  referred to pulmonary clinic in [place]  11/20/2022 by Dr Phillips Odor  for doe and cough x 2023  indolent onset and mildly progressive, some better p rx with ventolin prn     History of Present Illness  11/20/2022  Pulmonary/ 1st office eval/ Gabriella Hodges / [place] [facility]  Chief Complaint  Patient presents with   Establish Care  Dyspnea:  raking leaves x 30 min  vs sev hours prior to onset  Cough:   thick and sometimes green Sleep: sleeps in chair x 8 y  p thyroid surgery for cancer [facility]  SABA use: avg 2 x daily  02: none  Lung cancer screen: referred 11/20/2022  Rec     02/20/2023  f/u ov/[place] office/Gabriella Hodges re: *** maint on ***  No chief complaint on file.   Dyspnea:  *** Cough: *** Sleeping: ***   resp cc  SABA use: *** 02: ***  Lung cancer screening: ***   No obvious day to day or daytime variability or assoc excess/ purulent sputum or mucus plugs or hemoptysis or cp or chest tightness, subjective wheeze or overt sinus or hb symptoms.    Also denies any obvious fluctuation of symptoms with weather or environmental changes or other aggravating or alleviating factors except as outlined above   No unusual exposure hx or h/o childhood pna/ asthma or knowledge of premature birth.  Current Allergies, Complete Past Medical History, Past Surgical History, Family History, and Social History were reviewed in [employer] record.  ROS  The following are not active complaints unless bolded Hoarseness, sore throat, dysphagia, dental problems, itching, sneezing,  nasal congestion or discharge of excess mucus or purulent secretions, ear ache,   fever, chills, sweats, unintended wt loss or wt gain, classically pleuritic or exertional cp,  orthopnea pnd or arm/hand swelling  or leg swelling, presyncope, palpitations, abdominal pain, anorexia, nausea,  vomiting, diarrhea  or change in bowel habits or change in bladder habits, change in stools or change in urine, dysuria, hematuria,  rash, arthralgias, visual complaints, headache, numbness, weakness or ataxia or problems with walking or coordination,  change in mood or  memory.        No outpatient medications have been marked as taking for the 02/20/23 encounter (Appointment) with Nyoka Cowden, MD.          Past Medical History:  Diagnosis Date   Anxiety    Epilepsy (HCC)    GERD (gastroesophageal reflux disease)    Hypothyroidism    Thyroid disease       Objective:    Wts   02/20/2023        ***   02/18/23 165 lb (74.8 kg)  11/26/22 173 lb 11.6 oz (78.8 kg)  11/20/22 175 lb (79.4 kg)      Vital signs reviewed  02/20/2023  - Note at rest 02 sats  ***% on ***   General appearance:    ***   Mild bar***       Assessment

## 2023-02-18 NOTE — ED Triage Notes (Addendum)
 Pt with c/o headache, sore throat, cough, and painful urination since 02/16/23. States she has also noticed some blood in her urine. Pt c/o chest pain when coughing.

## 2023-02-18 NOTE — Progress Notes (Signed)
   02/18/23 1346  TOC Brief Assessment  Insurance and Status Reviewed  Patient has primary care physician Yes  Home environment has been reviewed Home  Prior level of function: independent  Prior/Current Home Services No current home services  Social Drivers of Health Review SDOH reviewed no interventions necessary  Readmission risk has been reviewed Yes  Transition of care needs no transition of care needs at this time   Admitted with Flu, no needs identified.   Transition of Care Department [facility]) has reviewed patient and no TOC needs have been identified at this time. We will continue to monitor patient advancement through interdisciplinary progression rounds. If new patient transition needs arise, please place a TOC consult.

## 2023-02-18 NOTE — Plan of Care (Signed)
   Problem: Education: Goal: Knowledge of General Education information will improve Description Including pain rating scale, medication(s)/side effects and non-pharmacologic comfort measures Outcome: Progressing

## 2023-02-18 NOTE — Plan of Care (Signed)

## 2023-02-19 DIAGNOSIS — J101 Influenza due to other identified influenza virus with other respiratory manifestations: Secondary | ICD-10-CM | POA: Diagnosis not present

## 2023-02-19 LAB — CBC
HCT: 39.5 % (ref 36.0–46.0)
Hemoglobin: 13.2 g/dL (ref 12.0–15.0)
MCH: 31.6 pg (ref 26.0–34.0)
MCHC: 33.4 g/dL (ref 30.0–36.0)
MCV: 94.5 fL (ref 80.0–100.0)
Platelets: 173 10*3/uL (ref 150–400)
RBC: 4.18 MIL/uL (ref 3.87–5.11)
RDW: 14.4 % (ref 11.5–15.5)
WBC: 5.7 10*3/uL (ref 4.0–10.5)
nRBC: 0 % (ref 0.0–0.2)

## 2023-02-19 LAB — BASIC METABOLIC PANEL
Anion gap: 7 (ref 5–15)
BUN: 12 mg/dL (ref 8–23)
CO2: 20 mmol/L — ABNORMAL LOW (ref 22–32)
Calcium: 7.9 mg/dL — ABNORMAL LOW (ref 8.9–10.3)
Chloride: 109 mmol/L (ref 98–111)
Creatinine, Ser: 0.59 mg/dL (ref 0.44–1.00)
GFR, Estimated: >60 mL/min (ref >60)
Glucose, Bld: 87 mg/dL (ref 70–99)
Potassium: 3.6 mmol/L (ref 3.5–5.1)
Sodium: 136 mmol/L (ref 135–145)

## 2023-02-19 LAB — MAGNESIUM: Magnesium: 2.2 mg/dL (ref 1.7–2.4)

## 2023-02-19 LAB — HIV ANTIBODY (ROUTINE TESTING W REFLEX): HIV Screen 4th Generation wRfx: NONREACTIVE

## 2023-02-19 MED ORDER — OSELTAMIVIR PHOSPHATE 75 MG PO CAPS: 75.0000 mg | ORAL_CAPSULE | Freq: Two times a day (BID) | ORAL | 0 refills | Status: AC

## 2023-02-19 MED ORDER — FOSFOMYCIN TROMETHAMINE 3 G PO PACK
3.0000 g | PACK | Freq: Once | ORAL | Status: AC
  Administered 2023-02-19: 3 g via ORAL
  Filled 2023-02-19: qty 3

## 2023-02-19 NOTE — Discharge Summary (Signed)
Physician Discharge Summary  Gabriella Hodges WUJ:[id number] DOB: [birth year] DOA: 02/18/2023  PCP: Gabriella Found, MD  Admit date: 02/18/2023  Discharge date: 02/19/2023  Admitted From:Home  Disposition:  Home  Recommendations for Outpatient Follow-up:  Follow up with PCP in 1-2 weeks, repeat BMP in 1 week. Continue on Tamiflu with complete course of treatment Given 1 dose of fosfomycin for UTI prior to discharge. Continue other home medications as prior  Home Health: None  Equipment/Devices: None  Discharge Condition:Stable  CODE STATUS: DN  Diet recommendation: Heart Healthy  Brief/Interim Summary: Gabriella Hodges is a 63 y.o. female with medical history significant of vertigo, seizure disorder, iron deficiency anemia, COPD, tobacco usage, and hypothyroidism.Patient presented to the ED on 2/8 with complaints of headache, sore throat, productive cough with green sputum for several days.  She has also had dysuria which apparently is a chronic problem but has no history of recurrent UTI.  She was admitted for acute COPD exacerbation secondary to influenza A infection along with concerns for UTI.  She is overall doing much better and did not require any oxygen supplementation during the course of this admission.  Her lung sounds are back to baseline and she continues to have some mild dysuria for which she will be given fosfomycin prior to discharge.  No other acute events or concerns noted aside from some hypokalemia which has now resolved.  Etiology for this is unclear and she will need to follow-up with PCP to repeat BMP in 1 week.  Discharge Diagnoses:  Principal Problem:   Influenza A  Principal discharge diagnosis: Acute COPD exacerbation secondary to influenza A.  UTI and hypokalemia.  Discharge Instructions  Discharge Instructions     Diet - low sodium heart healthy   Complete by: As directed    Increase activity slowly   Complete by: As directed       Allergies as  of 02/19/2023       Reactions   Shellfish-derived Products Anaphylaxis   Nsaids Hives   Penicillins Hives   Sulfa Antibiotics Hives        Medication List     TAKE these medications    Breztri Aerosphere 160-9-4.8 MCG/ACT Aero Generic drug: Budeson-Glycopyrrol-Formoterol Take 2 puffs first thing in am and then another 2 puffs about 12 hours later.   docusate sodium 100 MG capsule Commonly known as: COLACE Take 100 mg by mouth daily.   ibuprofen 200 MG tablet Commonly known as: ADVIL Take 200-800 mg by mouth every 6 (six) hours as needed for moderate pain (pain score 4-6).   levothyroxine 200 MCG tablet Commonly known as: SYNTHROID Take 200 mcg by mouth daily before breakfast.   omeprazole 40 MG capsule Commonly known as: PriLOSEC Take 1 capsule (40 mg total) by mouth daily.   oseltamivir 75 MG capsule Commonly known as: TAMIFLU Take 1 capsule (75 mg total) by mouth 2 (two) times daily for 4 days.   PHENobarbital 97.2 MG tablet Commonly known as: LUMINAL Take 97.2 mg by mouth at bedtime.   Ventolin HFA 108 (90 Base) MCG/ACT inhaler Generic drug: albuterol Inhale 2 puffs into the lungs every 4 (four) hours as needed.        Follow-up Information     Gabriella Found, MD. Schedule an appointment as soon as possible for a visit in 1 week(s).   Specialty: Family Medicine Contact information: [address] [place] [address] [phone]  Allergies  Allergen Reactions   Shellfish-Derived Products Anaphylaxis   Nsaids Hives   Penicillins Hives   Sulfa Antibiotics Hives    Consultations: None   Procedures/Studies: DG FEMUR, MIN 2 VIEWS RIGHT Result Date: 02/18/2023 CLINICAL DATA:  [id number] Fall [id number] EXAM: RIGHT FEMUR 2 VIEWS COMPARISON:  X-ray right hip and pelvis 02/18/2023, x-ray right knee 05/02/2010 FINDINGS: There is no evidence of fracture or other focal bone lesions. Tricompartmental severe degenerative changes of  the knee. Associated joint effusion. Soft tissues are unremarkable. Vascular calcification. IMPRESSION: No acute displaced fracture or dislocation. Electronically Signed   By: Tish Frederickson M.D.   On: 02/18/2023 02:13   DG Hip Unilat W or Wo Pelvis 2-3 Views Right Result Date: 02/18/2023 CLINICAL DATA:  fall EXAM: DG HIP (WITH OR WITHOUT PELVIS) 2-3V RIGHT COMPARISON:  None Available. FINDINGS: Limited evaluation due to overlapping osseous structures and overlying soft tissues. There is no evidence of hip fracture or dislocation of the right hip. No acute displaced fracture or dislocation of the left hip on frontal view. No acute displaced fracture or diastasis of the bones of pelvis. There is no evidence of arthropathy or other focal bone abnormality. Vascular calcification. IMPRESSION: Negative for acute traumatic injury. Electronically Signed   By: Tish Frederickson M.D.   On: 02/18/2023 02:12   CT HEAD WO CONTRAST ( ) Result Date: 02/18/2023 CLINICAL DATA:  Delirium?Stroke c/o headache, sore throat, cough, and painful urination since 02/16/23. EXAM: CT HEAD WITHOUT CONTRAST TECHNIQUE: Contiguous axial images were obtained from the base of the skull through the vertex without intravenous contrast. RADIATION DOSE REDUCTION: This exam was performed according to the departmental dose-optimization program which includes automated exposure control, adjustment of the mA and/or kV according to patient size and/or use of iterative reconstruction technique. COMPARISON:  CT head 12/19/2011 FINDINGS: Brain: No evidence of large-territorial acute infarction. No parenchymal hemorrhage. No mass lesion. No extra-axial collection. No mass effect or midline shift. No hydrocephalus. Basilar cisterns are patent. Vascular: No hyperdense vessel. Atherosclerotic calcifications are present within the cavernous internal carotid and vertebral arteries. Skull: No acute fracture or focal lesion. Sinuses/Orbits: Paranasal sinuses and  mastoid air cells are clear. The orbits are unremarkable. Other: None. IMPRESSION: No acute intracranial abnormality. Electronically Signed   By: Tish Frederickson M.D.   On: 02/18/2023 02:11   DG Chest Portable 1 View Result Date: 02/18/2023 CLINICAL DATA:  Headache with sore throat and cough. EXAM: PORTABLE CHEST 1 VIEW COMPARISON:  September 26, 2022 FINDINGS: The heart size and mediastinal contours are within normal limits. Surgical clips are seen overlying the expected region of the thyroid gland. There is no evidence of an acute infiltrate, pleural effusion or pneumothorax. Multilevel degenerative changes are seen throughout the thoracic spine. IMPRESSION: No active cardiopulmonary disease. Electronically Signed   By: Aram Candela M.D.   On: 02/18/2023 01:26     Discharge Exam: Vitals:   02/19/23 0428 02/19/23 0805  BP: 131/65   Pulse: 69   Resp: 19   Temp: 98 F (36.7 C)   SpO2: 99% 99%   Vitals:   02/18/23 2110 02/18/23 2114 02/19/23 0428 02/19/23 0805  BP:  130/66 131/65   Pulse:  68 69   Resp:  17 19   Temp:  98.9 F (37.2 C) 98 F (36.7 C)   TempSrc:  Oral Oral   SpO2: 96% 100% 99% 99%  Weight:      Height:        General: Pt is  alert, awake, not in acute distress Cardiovascular: RRR, S1/S2 +, no rubs, no gallops Respiratory: CTA bilaterally, no wheezing, no rhonchi Abdominal: Soft, NT, ND, bowel sounds + Extremities: no edema, no cyanosis    The results of significant diagnostics from this hospitalization (including imaging, microbiology, ancillary and laboratory) are listed below for reference.     Microbiology: Recent Results (from the past 240 hours)  Resp panel by RT-PCR (RSV, Flu A&B, Covid) Anterior Nasal Swab     Status: Abnormal   Collection Time: 02/18/23 12:18 AM   Specimen: Anterior Nasal Swab  Result Value Ref Range Status   SARS Coronavirus 2 by RT PCR NEGATIVE NEGATIVE Final    Comment: (NOTE) SARS-CoV-2 target nucleic acids are NOT  DETECTED.  The SARS-CoV-2 RNA is generally detectable in upper respiratory specimens during the acute phase of infection. The lowest concentration of SARS-CoV-2 viral copies this assay can detect is 138 copies/mL. A negative result does not preclude SARS-Cov-2 infection and should not be used as the sole basis for treatment or other patient management decisions. A negative result may occur with  improper specimen collection/handling, submission of specimen other than nasopharyngeal swab, presence of viral mutation(s) within the areas targeted by this assay, and inadequate number of viral copies(<138 copies/mL). A negative result must be combined with clinical observations, patient history, and epidemiological information. The expected result is Negative.  Fact Sheet for Patients:  BloggerCourse.com  Fact Sheet for Healthcare Providers:  SeriousBroker.it  This test is no t yet approved or cleared by the [place] FDA and  has been authorized for detection and/or diagnosis of SARS-CoV-2 by FDA under an Emergency Use Authorization (EUA). This EUA will remain  in effect (meaning this test can be used) for the duration of the COVID-19 declaration under Section 564(b)(1) of the Act, 21 U.S.C.section 360bbb-3(b)(1), unless the authorization is terminated  or revoked sooner.       Influenza A by PCR POSITIVE (A) NEGATIVE Final   Influenza B by PCR NEGATIVE NEGATIVE Final    Comment: (NOTE) The Xpert Xpress SARS-CoV-2/FLU/RSV plus assay is intended as an aid in the diagnosis of influenza from Nasopharyngeal swab specimens and should not be used as a sole basis for treatment. Nasal washings and aspirates are unacceptable for Xpert Xpress SARS-CoV-2/FLU/RSV testing.  Fact Sheet for Patients: BloggerCourse.com  Fact Sheet for Healthcare Providers: SeriousBroker.it  This test is not  yet approved or cleared by the [place] FDA and has been authorized for detection and/or diagnosis of SARS-CoV-2 by FDA under an Emergency Use Authorization (EUA). This EUA will remain in effect (meaning this test can be used) for the duration of the COVID-19 declaration under Section 564(b)(1) of the Act, 21 U.S.C. section 360bbb-3(b)(1), unless the authorization is terminated or revoked.     Resp Syncytial Virus by PCR NEGATIVE NEGATIVE Final    Comment: (NOTE) Fact Sheet for Patients: BloggerCourse.com  Fact Sheet for Healthcare Providers: SeriousBroker.it  This test is not yet approved or cleared by the [place] FDA and has been authorized for detection and/or diagnosis of SARS-CoV-2 by FDA under an Emergency Use Authorization (EUA). This EUA will remain in effect (meaning this test can be used) for the duration of the COVID-19 declaration under Section 564(b)(1) of the Act, 21 U.S.C. section 360bbb-3(b)(1), unless the authorization is terminated or revoked.  Performed at [facility], [address] [place], [place] [address]      Labs: BNP (last 3 results) No results for input(s): "BNP"  in the last 8760 hours. Basic Metabolic Panel: Recent Labs  Lab 02/18/23 0028 02/18/23 0157 02/18/23 1153 02/19/23 0420  NA 135 137 135 136  K 2.2* 2.3* 3.3* 3.6  CL 109 109 109 109  CO2 18* 20* 16* 20*  GLUCOSE 109* 96 93 87  BUN 11 11 11 12   CREATININE 0.69 0.71 0.60 0.59  CALCIUM 7.9* 7.9* 7.9* 7.9*  MG  --  1.9  --  2.2   Liver Function Tests: Recent Labs  Lab 02/18/23 0157  AST 11*  ALT 9  ALKPHOS 87  BILITOT 0.4  PROT 5.8*  ALBUMIN 3.2*   No results for input(s): "LIPASE", "AMYLASE" in the last 168 hours. No results for input(s): "AMMONIA" in the last 168 hours. CBC: Recent Labs  Lab 02/18/23 0028 02/18/23 0157 02/19/23 0420  WBC 8.0 7.6 5.7  HGB 13.9 13.5 13.2  HCT 39.5 40.3 39.5  MCV 92.7  93.9 94.5  PLT 194 189 173   Cardiac Enzymes: No results for input(s): "CKTOTAL", "CKMB", "CKMBINDEX", "TROPONINI" in the last 168 hours. BNP: Invalid input(s): "POCBNP" CBG: No results for input(s): "GLUCAP" in the last 168 hours. D-Dimer No results for input(s): "DDIMER" in the last 72 hours. Hgb A1c No results for input(s): "HGBA1C" in the last 72 hours. Lipid Profile No results for input(s): "CHOL", "HDL", "LDLCALC", "TRIG", "CHOLHDL", "LDLDIRECT" in the last 72 hours. Thyroid function studies No results for input(s): "TSH", "T4TOTAL", "T3FREE", "THYROIDAB" in the last 72 hours.  Invalid input(s): "FREET3" Anemia work up No results for input(s): "VITAMINB12", "FOLATE", "FERRITIN", "TIBC", "IRON", "RETICCTPCT" in the last 72 hours. Urinalysis    Component Value Date/Time   COLORURINE YELLOW 02/18/2023 0049   APPEARANCEUR TURBID (A) 02/18/2023 0049   LABSPEC 1.017 02/18/2023 0049   PHURINE 6.0 02/18/2023 0049   GLUCOSEU NEGATIVE 02/18/2023 0049   HGBUR LARGE (A) 02/18/2023 0049   BILIRUBINUR NEGATIVE 02/18/2023 0049   KETONESUR NEGATIVE 02/18/2023 0049   PROTEINUR 100 (A) 02/18/2023 0049   UROBILINOGEN 0.2 12/20/2011 0359   NITRITE POSITIVE (A) 02/18/2023 0049   LEUKOCYTESUR MODERATE (A) 02/18/2023 0049   Sepsis Labs Recent Labs  Lab 02/18/23 0028 02/18/23 0157 02/19/23 0420  WBC 8.0 7.6 5.7   Microbiology Recent Results (from the past 240 hours)  Resp panel by RT-PCR (RSV, Flu A&B, Covid) Anterior Nasal Swab     Status: Abnormal   Collection Time: 02/18/23 12:18 AM   Specimen: Anterior Nasal Swab  Result Value Ref Range Status   SARS Coronavirus 2 by RT PCR NEGATIVE NEGATIVE Final    Comment: (NOTE) SARS-CoV-2 target nucleic acids are NOT DETECTED.  The SARS-CoV-2 RNA is generally detectable in upper respiratory specimens during the acute phase of infection. The lowest concentration of SARS-CoV-2 viral copies this assay can detect is 138 copies/mL. A  negative result does not preclude SARS-Cov-2 infection and should not be used as the sole basis for treatment or other patient management decisions. A negative result may occur with  improper specimen collection/handling, submission of specimen other than nasopharyngeal swab, presence of viral mutation(s) within the areas targeted by this assay, and inadequate number of viral copies(<138 copies/mL). A negative result must be combined with clinical observations, patient history, and epidemiological information. The expected result is Negative.  Fact Sheet for Patients:  BloggerCourse.com  Fact Sheet for Healthcare Providers:  SeriousBroker.it  This test is no t yet approved or cleared by the [place] FDA and  has been authorized for detection and/or diagnosis of  SARS-CoV-2 by FDA under an Emergency Use Authorization (EUA). This EUA will remain  in effect (meaning this test can be used) for the duration of the COVID-19 declaration under Section 564(b)(1) of the Act, 21 U.S.C.section 360bbb-3(b)(1), unless the authorization is terminated  or revoked sooner.       Influenza A by PCR POSITIVE (A) NEGATIVE Final   Influenza B by PCR NEGATIVE NEGATIVE Final    Comment: (NOTE) The Xpert Xpress SARS-CoV-2/FLU/RSV plus assay is intended as an aid in the diagnosis of influenza from Nasopharyngeal swab specimens and should not be used as a sole basis for treatment. Nasal washings and aspirates are unacceptable for Xpert Xpress SARS-CoV-2/FLU/RSV testing.  Fact Sheet for Patients: BloggerCourse.com  Fact Sheet for Healthcare Providers: SeriousBroker.it  This test is not yet approved or cleared by the [place] FDA and has been authorized for detection and/or diagnosis of SARS-CoV-2 by FDA under an Emergency Use Authorization (EUA). This EUA will remain in effect (meaning this test  can be used) for the duration of the COVID-19 declaration under Section 564(b)(1) of the Act, 21 U.S.C. section 360bbb-3(b)(1), unless the authorization is terminated or revoked.     Resp Syncytial Virus by PCR NEGATIVE NEGATIVE Final    Comment: (NOTE) Fact Sheet for Patients: BloggerCourse.com  Fact Sheet for Healthcare Providers: SeriousBroker.it  This test is not yet approved or cleared by the [place] FDA and has been authorized for detection and/or diagnosis of SARS-CoV-2 by FDA under an Emergency Use Authorization (EUA). This EUA will remain in effect (meaning this test can be used) for the duration of the COVID-19 declaration under Section 564(b)(1) of the Act, 21 U.S.C. section 360bbb-3(b)(1), unless the authorization is terminated or revoked.  Performed at [facility], [address] [place], [place] [address]      Time coordinating discharge: 35 minutes  SIGNED:   Erick Blinks, DO [facility] 02/19/2023, 9:18 AM  If 7PM-7AM, please contact night-coverage [email or web address]

## 2023-02-20 ENCOUNTER — Ambulatory Visit: Payer: Medicaid Other | Admitting: Internal Medicine

## 2023-02-20 LAB — URINE CULTURE: Culture: >=100000 — AB

## 2023-02-26 ENCOUNTER — Ambulatory Visit (HOSPITAL_COMMUNITY)
Admission: RE | Admit: 2023-02-26 | Discharge: 2023-02-26 | Disposition: A | Discharge status: Home / Self Care | Payer: Medicaid Other | Source: Ambulatory Visit | Attending: Internal Medicine | Admitting: Internal Medicine

## 2023-03-17 NOTE — Progress Notes (Unsigned)
 JIYA KISSINGER, female    DOB: May 04, 1960    MRN: [id number]   Brief patient profile:  18  yowf  active smoker  referred to pulmonary clinic in [place]  11/20/2022 by Dr Phillips Odor  for doe and cough x 2023  indolent onset and mildly progressive, some better p rx with ventolin prn     History of Present Illness  11/20/2022  Pulmonary/ 1st office eval/ Posie Lillibridge / [place] [facility]  Chief Complaint  Patient presents with   Establish Care  Dyspnea:  raking leaves x 30 min  vs sev hours prior to onset  Cough:   thick and sometimes green Sleep: sleeps in chair x 8 y  p thyroid surgery for cancer [facility]  SABA use: avg 2 x daily  02: none  Lung cancer screen: referred 11/20/2022  Rec Plan A = Automatic = Always=    Breztri Take 2 puffs first thing in am and then another 2 puffs about 12 hours later.  Work on inhaler technique:  Plan B = Backup (to supplement plan A, not to replace it) Only use your albuterol inhaler as a rescue medication  For cough/ congestion > mucinex or mucinex dm  up to maximum of  1200 mg every 12 hours as needed  Omeprzole 40 mg   Take  30-60 min before first meal of the day and Pepcid (famotidine)  20 mg after supper until return to office      12/18/22 [facility]  RADS 2/ Centrilobular emphysema   Admit date: 02/18/2023 Discharge date: 02/19/2023 Recommendations for Outpatient Follow-up:  Follow up with PCP in 1-2 weeks, repeat BMP in 1 week. Continue on Tamiflu with complete course of treatment Given 1 dose of fosfomycin for UTI prior to discharge. Continue other home medications as prior   Diet recommendation: Heart Healthy   Brief/Interim Summary: BRIGITTE SODERBERG is a 63 y.o. female with medical history significant of vertigo, seizure disorder, iron deficiency anemia, COPD, tobacco usage, and hypothyroidism.Patient presented to the ED on 2/8 with complaints of headache, sore throat, productive cough with green sputum for several days.  She has also had dysuria  which apparently is a chronic problem but has no history of recurrent UTI.  She was admitted for acute COPD exacerbation secondary to influenza A infection along with concerns for UTI.  She is overall doing much better and did not require any oxygen supplementation during the course of this admission.  Her lung sounds are back to baseline and she continues to have some mild dysuria for which she will be given fosfomycin prior to discharge.  No other acute events or concerns noted aside from some hypokalemia which has now resolved.  Etiology for this is unclear and she will need to follow-up with PCP to repeat BMP in 1 week.    03/18/2023  f/u ov/[place] office/Alfred Eckley MW:[id number] on [facility]/  back to baseline = dry cough ever since thyroid surgery in 1990s maint on  breztri/ppi q am  Chief Complaint  Patient presents with   Follow-up    3 month follow up   Dyspnea:  more tired than doe  Cough: non-productive  Sleeping:  no resp cc upright x years  SABA use: one twice a week 02: none     No obvious day to day or daytime variability or assoc excess/ purulent sputum or mucus plugs or hemoptysis or cp or chest tightness, subjective wheeze or overt sinus or hb symptoms.    Also denies any obvious  fluctuation of symptoms with weather or environmental changes or other aggravating or alleviating factors except as outlined above   No unusual exposure hx or h/o childhood pna/ asthma or knowledge of premature birth.  Current Allergies, Complete Past Medical History, Past Surgical History, Family History, and Social History were reviewed in [employer] record.  ROS  The following are not active complaints unless bolded Hoarseness, sore throat, dysphagia= globus , dental problems, itching, sneezing,  nasal congestion or discharge of excess mucus or purulent secretions, ear ache,   fever, chills, sweats, unintended wt loss or wt gain, classically pleuritic or exertional cp,  orthopnea  pnd or arm/hand swelling  or leg swelling, presyncope, palpitations, abdominal pain, anorexia, nausea, vomiting, diarrhea  or change in bowel habits or change in bladder habits, change in stools or change in urine, dysuria, hematuria,  rash, arthralgias, visual complaints, headache, numbness, weakness or ataxia or problems with walking or coordination,  change in mood or  memory.        Current Meds  Medication Sig   Budeson-Glycopyrrol-Formoterol (BREZTRI AEROSPHERE) 160-9-4.8 MCG/ACT AERO Take 2 puffs first thing in am and then another 2 puffs about 12 hours later.   docusate sodium (COLACE) 100 MG capsule Take 100 mg by mouth daily.   ibuprofen (ADVIL) 200 MG tablet Take 200-800 mg by mouth every 6 (six) hours as needed for moderate pain (pain score 4-6).   levothyroxine (SYNTHROID) 200 MCG tablet Take 200 mcg by mouth daily before breakfast.   omeprazole (PRILOSEC) 40 MG capsule Take 1 capsule (40 mg total) by mouth daily.   PHENobarbital (LUMINAL) 97.2 MG tablet Take 97.2 mg by mouth at bedtime.     traMADol (ULTRAM) 50 MG tablet Take 50 mg by mouth every 4 (four) hours as needed.   VENTOLIN HFA 108 (90 Base) MCG/ACT inhaler Inhale 2 puffs into the lungs every 4 (four) hours as needed.          Past Medical History:  Diagnosis Date   Anxiety    Epilepsy (HCC)    GERD (gastroesophageal reflux disease)    Hypothyroidism    Thyroid disease       Objective:    Wts   03/18/2023        164   02/18/23 165 lb (74.8 kg)  11/26/22 173 lb 11.6 oz (78.8 kg)  11/20/22 175 lb (79.4 kg)    Vital signs reviewed  03/18/2023  - Note at rest 02 sats  98% on RA   General appearance:    amb somber hoarse elderly wf  / prominent psuedowheeze   HEENT : Oropharynx  clear   Nasal turbinates nl    NECK :  without  apparent JVD/ palpable Nodes/TM    LUNGS: no acc muscle use,  Mild barrel  contour chest wall with bilateral  Distant bs s audible wheeze and  without cough on insp or exp maneuvers   and mild  Hyperresonant  to  percussion bilaterally     CV:  RRR  no s3 or murmur or increase in P2, and no edema   ABD:  soft and nontender with pos end  insp Hoover's  in the supine position.  No bruits or organomegaly appreciated   MS: brace on R knee, mild limp, ext warm without deformities Or obvious    calf tenderness, cyanosis or clubbing     SKIN: warm and dry without lesions    NEURO:  alert, approp, nl sensorium with  no motor or cerebellar deficits apparent.         Assessment

## 2023-03-18 ENCOUNTER — Ambulatory Visit (INDEPENDENT_AMBULATORY_CARE_PROVIDER_SITE_OTHER): Payer: Medicaid Other | Admitting: Internal Medicine

## 2023-03-18 ENCOUNTER — Encounter: Payer: Self-pay | Admitting: Internal Medicine

## 2023-03-18 VITALS — BP 159/84 | HR 68 | Ht 62.0 in | Wt 164.0 lb

## 2023-03-18 DIAGNOSIS — R058 Other specified cough: Secondary | ICD-10-CM | POA: Diagnosis not present

## 2023-03-18 DIAGNOSIS — F17210 Nicotine dependence, cigarettes, uncomplicated: Secondary | ICD-10-CM

## 2023-03-18 DIAGNOSIS — J449 Chronic obstructive pulmonary disease, unspecified: Secondary | ICD-10-CM | POA: Diagnosis not present

## 2023-03-18 MED ORDER — FAMOTIDINE 20 MG PO TABS: ORAL_TABLET | ORAL | 11 refills | Status: AC

## 2023-03-18 NOTE — Patient Instructions (Addendum)
 Ok to try taking breztri one twice daily   Only use your albuterol as a rescue medication to be used if you can't catch your breath by resting or doing a relaxed purse lip breathing pattern.  - The less you use it, the better it will work when you need it. - Ok to use up to 2 puffs  every 4 hours if you must but call for immediate appointment if use goes up over your usual need - Don't leave home without it !!  (think of it like the spare tire for your car)   Start taking pepcid 20 mg after supper  My office will be contacting you by phone for referral to [facility]  and PFTs - if you don't hear back from my office within one week please call us back or notify us thru MyChart and we'll address it right away.   Please schedule a follow up visit in 3 months but call sooner if needed

## 2023-03-19 NOTE — Assessment & Plan Note (Addendum)
 Active smoker -  11/20/2022  After extensive coaching inhaler device,  effectiveness =    60% (short ti) > try breztri and approp saba  -  12/18/22 [facility]  RADS 2/ Centrilobular emphysema  - 03/18/2023  After extensive coaching inhaler device,  effectiveness =    75% hfa > continue breztri but try reduce to one bid and min saba due to tremor from full dose laba

## 2023-03-19 NOTE — Assessment & Plan Note (Signed)
 Counseled re importance of smoking cessation but did not meet time criteria for separate billing    F/u 3 m, sooner if needed  Each maintenance medication was reviewed in detail including emphasizing most importantly the difference between maintenance and prns and under what circumstances the prns are to be triggered using an action plan format where appropriate.  Total time for H and P, chart review, counseling, reviewing hfa device(s) and generating customized AVS unique to this office visit / same day charting = 32 min

## 2023-03-19 NOTE — Assessment & Plan Note (Signed)
 Onset p thyroid surgery in the 1990s  - refer to [facility] 03/18/2023 >>>   Daytime only sense of globus/ need to clear throat > c/w Upper airway cough syndrome (previously labeled PNDS),  is so named because it's frequently impossible to sort out how much is  CR/sinusitis with freq throat clearing (which can be related to primary GERD)   vs  causing  secondary (" extra esophageal")  GERD from wide swings in gastric pressure that occur with throat clearing, often  promoting self use of mint and menthol lozenges that reduce the lower esophageal sphincter tone and exacerbate the problem further in a cyclical fashion.   These are the same pts (now being labeled as having "irritable larynx syndrome" by some cough centers) who not infrequently have a history of having failed to tolerate ace inhibitors,  dry powder inhalers or biphosphonates or report having atypical/extraesophageal reflux symptoms that don't respond to standard doses of PPI  and are easily confused as having aecopd or asthma flares by even experienced allergists/ pulmonologists (myself included).   >>>> ENT eval next/ max gerd rx with pepcid 20 mg p supper

## 2023-03-26 ENCOUNTER — Ambulatory Visit: Payer: Medicaid Other | Admitting: Internal Medicine

## 2023-04-01 ENCOUNTER — Encounter: Payer: Self-pay | Admitting: Orthopedic Surgery

## 2023-04-01 ENCOUNTER — Ambulatory Visit: Admitting: Orthopedic Surgery

## 2023-04-01 VITALS — BP 158/89 | HR 70 | Ht 62.0 in | Wt 164.0 lb

## 2023-04-01 DIAGNOSIS — M1711 Unilateral primary osteoarthritis, right knee: Secondary | ICD-10-CM

## 2023-04-01 DIAGNOSIS — M25461 Effusion, right knee: Secondary | ICD-10-CM

## 2023-04-01 MED ORDER — METHYLPREDNISOLONE ACETATE 40 MG/ML IJ SUSP
40.0000 mg | Freq: Once | INTRAMUSCULAR | Status: AC
  Administered 2023-04-01: 40 mg via INTRA_ARTICULAR

## 2023-04-01 NOTE — Progress Notes (Signed)
 Subjective:     Patient ID: Gabriella Hodges, female   DOB: 12-13-1960, 63 y.o.   MRN: [id number]  Knee Pain  Incident onset: 10 years of pain. Incident location: History of fall started after that. The pain is present in the right knee. The quality of the pain is described as aching. The pain is at a severity of 8/10. The pain has been Fluctuating since onset. Associated symptoms include an inability to bear weight and a loss of motion. Pertinent negatives include no loss of sensation, muscle weakness, numbness or tingling. The symptoms are aggravated by weight bearing. She has tried NSAIDs for the symptoms. The treatment provided mild relief.   63 year old female recurrent intermittent right knee pain with swelling stiffness and increased pain with weightbearing  Review of Systems  Neurological:  Negative for tingling and numbness.       Objective:   Physical Exam Vitals and nursing note reviewed.  Constitutional:      Appearance: Normal appearance.  HENT:     Head: Normocephalic and atraumatic.  Eyes:     General: No scleral icterus.       Right eye: No discharge.        Left eye: No discharge.     Extraocular Movements: Extraocular movements intact.     Conjunctiva/sclera: Conjunctivae normal.     Pupils: Pupils are equal, round, and reactive to light.  Cardiovascular:     Rate and Rhythm: Normal rate.     Pulses: Normal pulses.  Musculoskeletal:     Comments: Large joint effusion  Skin intact no erythema or bruising  Tenderness medial and lateral compartment  Crepitance on range of motion  Range of motion 5-120 degrees  Stability tests were normal  Muscle tone and strength normal  Provocative test for medial meniscus lateral meniscus negative  Skin:    General: Skin is warm and dry.     Capillary Refill: Capillary refill takes less than 2 seconds.  Neurological:     General: No focal deficit present.     Mental Status: She is alert and oriented to person,  place, and time.     Gait: Gait abnormal.  Psychiatric:        Mood and Affect: Mood normal.        Behavior: Behavior normal.        Thought Content: Thought content normal.        Judgment: Judgment normal.        Assessment:     Encounter Diagnoses  Name Primary?   Primary osteoarthritis of right knee Yes   Effusion, right knee    The patient had outside images on December 2012 2024 I will interpret those.  The patient has narrowing of the medial compartment squaring of the condyle medial and lateral osteopenia with sclerosis of the subchondral bone and osteophytes on the medial compartment and patellofemoral compartment and also in the posterior tibia and femur there is also an anterior osteophyte on the tibia      Plan:      63 year old female with osteoarthritis fairly severe  She has not had real nonoperative care yet so we will start that  We will start with aspiration injection Followed by continue anti-inflammatories with ibuprofen Home exercises If symptoms fail to improve  Procedure note injection and aspiration right knee joint  Verbal consent was obtained to aspirate and inject the right knee joint   Timeout was completed to confirm the site of aspiration and injection  An 18-gauge needle was used to aspirate the knee joint from a suprapatellar lateral approach.  The medications used were 40 mg of Depo-Medrol and 1% lidocaine 3 cc  Anesthesia was provided by ethyl chloride and the skin was prepped with alcohol.  After cleaning the skin with alcohol an 18-gauge needle was used to aspirate the right knee joint.  We obtained 30 cc of fluid clear yellow  We follow this by injection of 40 mg of Depo-Medrol and 3 cc 1% lidocaine.  There were no complications. A sterile bandage was applied.

## 2023-04-01 NOTE — Patient Instructions (Signed)
 Rest the knee when it hurts   Decrease the amount of activity on the knee when you see swelling and apply ice for 20 min   Continue ibuprofen as needed for pain; you can also take tylenol extra strength 500 mg every 6 hrs   Do the knee exercises for 6 weeks

## 2023-04-01 NOTE — Progress Notes (Signed)
  Intake history:  There were no vitals taken for this visit. There is no height or weight on file to calculate BMI.    WHAT ARE WE SEEING YOU FOR TODAY?   right knee(s)  How long has this bothered you? (DOI?DOS?WS?)  approximately 10 years(s) ago  Anticoag.  No  Diabetes No  Heart disease No  Hypertension No  SMOKING HX Yes  Kidney disease No  Any ALLERGIES ______________________________________________   Treatment:  Have you taken:  Tylenol No  Advil Yes  Had PT No  Had injection No  Other  _________________brace ________

## 2023-04-30 ENCOUNTER — Ambulatory Visit (HOSPITAL_COMMUNITY): Admission: RE | Admit: 2023-04-30 | Source: Ambulatory Visit

## 2023-05-21 ENCOUNTER — Inpatient Hospital Stay: Payer: Medicaid Other

## 2023-05-22 ENCOUNTER — Ambulatory Visit (INDEPENDENT_AMBULATORY_CARE_PROVIDER_SITE_OTHER): Admitting: Otolaryngology

## 2023-05-22 ENCOUNTER — Inpatient Hospital Stay: Attending: Hematology

## 2023-05-22 ENCOUNTER — Encounter (INDEPENDENT_AMBULATORY_CARE_PROVIDER_SITE_OTHER): Payer: Self-pay | Admitting: Otolaryngology

## 2023-05-22 VITALS — BP 171/92 | HR 67 | Ht 63.0 in | Wt 156.0 lb

## 2023-05-22 DIAGNOSIS — J342 Deviated nasal septum: Secondary | ICD-10-CM

## 2023-05-22 DIAGNOSIS — R002 Palpitations: Secondary | ICD-10-CM | POA: Diagnosis not present

## 2023-05-22 DIAGNOSIS — J449 Chronic obstructive pulmonary disease, unspecified: Secondary | ICD-10-CM

## 2023-05-22 DIAGNOSIS — R0982 Postnasal drip: Secondary | ICD-10-CM

## 2023-05-22 DIAGNOSIS — D508 Other iron deficiency anemias: Secondary | ICD-10-CM

## 2023-05-22 DIAGNOSIS — R079 Chest pain, unspecified: Secondary | ICD-10-CM | POA: Diagnosis not present

## 2023-05-22 DIAGNOSIS — R053 Chronic cough: Secondary | ICD-10-CM | POA: Diagnosis not present

## 2023-05-22 DIAGNOSIS — R110 Nausea: Secondary | ICD-10-CM | POA: Insufficient documentation

## 2023-05-22 DIAGNOSIS — E538 Deficiency of other specified B group vitamins: Secondary | ICD-10-CM | POA: Insufficient documentation

## 2023-05-22 DIAGNOSIS — R5383 Other fatigue: Secondary | ICD-10-CM | POA: Insufficient documentation

## 2023-05-22 DIAGNOSIS — R490 Dysphonia: Secondary | ICD-10-CM

## 2023-05-22 DIAGNOSIS — D509 Iron deficiency anemia, unspecified: Secondary | ICD-10-CM | POA: Insufficient documentation

## 2023-05-22 DIAGNOSIS — K219 Gastro-esophageal reflux disease without esophagitis: Secondary | ICD-10-CM

## 2023-05-22 DIAGNOSIS — Z9884 Bariatric surgery status: Secondary | ICD-10-CM | POA: Insufficient documentation

## 2023-05-22 DIAGNOSIS — J3089 Other allergic rhinitis: Secondary | ICD-10-CM

## 2023-05-22 DIAGNOSIS — F17210 Nicotine dependence, cigarettes, uncomplicated: Secondary | ICD-10-CM

## 2023-05-22 DIAGNOSIS — Z79899 Other long term (current) drug therapy: Secondary | ICD-10-CM | POA: Insufficient documentation

## 2023-05-22 DIAGNOSIS — R059 Cough, unspecified: Secondary | ICD-10-CM | POA: Insufficient documentation

## 2023-05-22 DIAGNOSIS — R42 Dizziness and giddiness: Secondary | ICD-10-CM | POA: Insufficient documentation

## 2023-05-22 DIAGNOSIS — J343 Hypertrophy of nasal turbinates: Secondary | ICD-10-CM | POA: Diagnosis not present

## 2023-05-22 DIAGNOSIS — G4700 Insomnia, unspecified: Secondary | ICD-10-CM | POA: Diagnosis not present

## 2023-05-22 DIAGNOSIS — R0602 Shortness of breath: Secondary | ICD-10-CM | POA: Diagnosis not present

## 2023-05-22 DIAGNOSIS — R1013 Epigastric pain: Secondary | ICD-10-CM | POA: Insufficient documentation

## 2023-05-22 DIAGNOSIS — F17200 Nicotine dependence, unspecified, uncomplicated: Secondary | ICD-10-CM

## 2023-05-22 LAB — CBC WITH DIFFERENTIAL/PLATELET
Abs Immature Granulocytes: 0.01 10*3/uL (ref 0.00–0.07)
Basophils Absolute: 0 10*3/uL (ref 0.0–0.1)
Basophils Relative: 1 %
Eosinophils Absolute: 0.2 10*3/uL (ref 0.0–0.5)
Eosinophils Relative: 4 %
HCT: 40.4 % (ref 36.0–46.0)
Hemoglobin: 14.1 g/dL (ref 12.0–15.0)
Immature Granulocytes: 0 %
Lymphocytes Relative: 21 %
Lymphs Abs: 1.2 10*3/uL (ref 0.7–4.0)
MCH: 33.8 pg (ref 26.0–34.0)
MCHC: 34.9 g/dL (ref 30.0–36.0)
MCV: 96.9 fL (ref 80.0–100.0)
Monocytes Absolute: 0.5 10*3/uL (ref 0.1–1.0)
Monocytes Relative: 9 %
Neutro Abs: 3.7 10*3/uL (ref 1.7–7.7)
Neutrophils Relative %: 65 %
Platelets: 274 10*3/uL (ref 150–400)
RBC: 4.17 MIL/uL (ref 3.87–5.11)
RDW: 13.6 % (ref 11.5–15.5)
WBC: 5.7 10*3/uL (ref 4.0–10.5)
nRBC: 0 % (ref 0.0–0.2)

## 2023-05-22 LAB — IRON AND TIBC
Iron: 45 ug/dL (ref 28–170)
Saturation Ratios: 19 % (ref 10.4–31.8)
TIBC: 242 ug/dL — ABNORMAL LOW (ref 250–450)
UIBC: 197 ug/dL

## 2023-05-22 LAB — VITAMIN B12: Vitamin B-12: 346 pg/mL (ref 180–914)

## 2023-05-22 LAB — FERRITIN: Ferritin: 77 ng/mL (ref 11–307)

## 2023-05-22 MED ORDER — FLUTICASONE PROPIONATE 50 MCG/ACT NA SUSP: 2.0000 | Freq: Two times a day (BID) | NASAL | 6 refills | Status: AC

## 2023-05-22 MED ORDER — LEVOCETIRIZINE DIHYDROCHLORIDE 5 MG PO TABS
5.0000 mg | ORAL_TABLET | Freq: Every evening | ORAL | 3 refills | Status: AC
Start: 2023-05-22 — End: ?

## 2023-05-22 NOTE — Patient Instructions (Addendum)
 GamingLesson.nl - check out this website to learn more about reflux   -Avoid lying down for at least two hours after a meal or after drinking acidic beverages, like soda, or other caffeinated beverages. This can help to prevent stomach contents from flowing back into the esophagus. -Keep your head elevated while you sleep. Using an extra pillow or two can also help to prevent reflux. -Eat smaller and more frequent meals each day instead of a few large meals. This promotes digestion and can aid in preventing heartburn. -Wear loose-fitting clothes to ease pressure on the stomach, which can worsen heartburn and reflux. -Reduce excess weight around the midsection. This can ease pressure on the stomach. Such pressure can force some stomach contents back up the esophagus - Take Reflux Gourmet (natural supplement available on [employer]) to help with symptoms of chronic throat irritation      Dear Virgel Griffes,   Congratulations for your interest in quitting smoking!  Find a program that suits you best: when you want to quit, how you need support, where you live, and how you like to learn.    If you're ready to get started TODAY, consider scheduling a visit through [email or web address].  Appointments are available from 8am to 8pm, Monday to Friday.   Most health insurance plans will cover some level of tobacco cessation visits and medications.    Additional Resources: [employer] are also available to help you quit & provide the support you'll need. Many programs are available in both [place] and Spanish and have a long history of successfully helping people get off and stay off tobacco.    Quit Smoking Apps:  quitSTART at [employer]iousBroker.de QuitGuide?at ForgetParking.dk Online education and resources: Smokefree  at [employer].gov Free Telephone Coaching: QuitNow,  Call 1-800-QUIT-NOW (573-183-9290) or Text- Ready to (704)404-1879  *[facility]uitline [employer] has teamed up with Medicaid to offer a free 14 week program    Vaping- Want to Quit? Free 24/7 support. Call [facility]  [place], [place], [place], [place], [place]  [facility]

## 2023-05-22 NOTE — Progress Notes (Signed)
 ENT CONSULT:  Reason for Consult: chronic cough   HPI: Discussed the use of AI scribe software for clinical note transcription with the patient, who gave verbal consent to proceed.  History of Present Illness Gabriella Hodges is a 63 year old female with hx of COPD who presents with chronic cough. She was referred by Dr. Waymond Hailey for evaluation of chronic cough and throat issues.  She has experienced a chronic cough for several years, which is sometimes productive with yellow or green sputum. She has a history of COPD and continues to smoke, although she has reduced her smoking. She is uncertain about quitting permanently.  She has a history of sinus issues, frequently experiencing what she describes as a 'sinus congestion' and has been back and forth to the doctor for it. She has not used nasal sprays or allergy pills and has not undergone nasal or sinus surgery. .  She has a history of heartburn or reflux and is currently taking omeprazole  and another medication Famotidine . She underwent thyroid  surgery in 2012 and since then has experienced throat issues, including occasional loss of voice/hoarseness.   Records Reviewed:  OV with Pulm Dr Waymond Hailey 03/18/23 Brief patient profile:  27  yowf  active smoker  referred to pulmonary clinic in [place]  11/20/2022 by Dr Glady Laming  for doe and cough x 2023  indolent onset and mildly progressive, some better p rx with ventolin  PRN   Dyspnea:  raking leaves x 30 min  vs sev hours prior to onset  Cough:   thick and sometimes green Sleep: sleeps in chair x 8 y  p thyroid  surgery for cancer [facility]  SABA use: avg 2 x daily  02: none  Lung cancer screen: referred 11/20/2022  Rec Plan A = Automatic = Always=    Breztri  Take 2 puffs first thing in am and then another 2 puffs about 12 hours later.  Work on inhaler technique:  Plan B = Backup (to supplement plan A, not to replace it) Only use your albuterol  inhaler as a rescue medication  For cough/ congestion  > mucinex or mucinex dm  up to maximum of  1200 mg every 12 hours as needed  Omeprzole 40 mg   Take  30-60 min before first meal of the day and Pepcid  (famotidine )  20 mg after supper until return to office   Hoarseness, sore throat, dysphagia= globus   Upper Airway Cough Sy - onset after thyroid  surgery 1990's - was referred to ENT   Daytime only sense of globus/ need to clear throat > c/w Upper airway cough syndrome (previously labeled PNDS),  is so named because it's frequently impossible to sort out how much is  CR/sinusitis with freq throat clearing (which can be related to primary GERD)   vs  causing  secondary (" extra esophageal")  GERD from wide swings in gastric pressure that occur with throat clearing, often  promoting self use of mint and menthol lozenges that reduce the lower esophageal sphincter tone and exacerbate the problem further in a cyclical fashion.    These are the same pts (now being labeled as having "irritable larynx syndrome" by some cough centers) who not infrequently have a history of having failed to tolerate ace inhibitors,  dry powder inhalers or biphosphonates or report having atypical/extraesophageal reflux symptoms that don't respond to standard doses of PPI  and are easily confused as having aecopd or asthma flares by even experienced allergists/ pulmonologists (myself included).     Admitted  for productive cough and sputum, and dyspnea 2 months ago      Past Medical History:  Diagnosis Date   Anxiety    Epilepsy (HCC)    GERD (gastroesophageal reflux disease)    Hypothyroidism    Thyroid  disease     Past Surgical History:  Procedure Laterality Date   GASTRIC BYPASS     THYROID  SURGERY     tumors   tumors     breast-benign    Family History  Problem Relation Age of Onset   Thyroid  cancer Mother    Breast cancer Sister    Heart disease Other    Lung disease Other    Cancer Other    Diabetes Other    Colon cancer Neg Hx     Social  History:  reports that she has been smoking cigarettes. She has a 15 pack-year smoking history. She has never used smokeless tobacco. She reports that she does not drink alcohol and does not use drugs.  Allergies:  Allergies  Allergen Reactions   Shellfish-Derived Products Anaphylaxis   Nsaids Hives   Penicillins Hives   Sulfa Antibiotics Hives    Medications: I have reviewed the patient's current medications.  The PMH, PSH, Medications, Allergies, and SH were reviewed and updated.  ROS: Constitutional: Negative for fever, weight loss and weight gain. Cardiovascular: Negative for chest pain and dyspnea on exertion. Respiratory: Is not experiencing shortness of breath at rest. Gastrointestinal: Negative for nausea and vomiting. Neurological: Negative for headaches. Psychiatric: The patient is not nervous/anxious  Blood pressure (!) 171/92, pulse 67, height 5\' 3"  (1.6 m), weight 156 lb (70.8 kg), SpO2 95%. Body mass index is 27.63 kg/m.  PHYSICAL EXAM:  Exam: General: Well-developed, well-nourished Communication and Voice: Clear pitch and clarity Respiratory Respiratory effort: Equal inspiration and expiration without stridor Cardiovascular Peripheral Vascular: Warm extremities with equal color/perfusion Eyes: No nystagmus with equal extraocular motion bilaterally Neuro/Psych/Balance: Patient oriented to person, place, and time; Appropriate mood and affect; Gait is intact with no imbalance; Cranial nerves I-XII are intact Head and Face Inspection: Normocephalic and atraumatic without mass or lesion Palpation: Facial skeleton intact without bony stepoffs Salivary Glands: No mass or tenderness Facial Strength: Facial motility symmetric and full bilaterally ENT Pinna: External ear intact and fully developed External canal: Canal is patent with intact skin Tympanic Membrane: Clear and mobile External Nose: No scar or anatomic deformity Internal Nose: Septum is S-shaped. No  polyp, or purulence. Mucosal edema and erythema present.  Bilateral inferior turbinate hypertrophy.  Lips, Teeth, and gums: Mucosa and teeth intact and viable TMJ: No pain to palpation with full mobility Oral cavity/oropharynx: No erythema or exudate, no lesions present Nasopharynx: No mass or lesion with intact mucosa Hypopharynx: Intact mucosa without pooling of secretions Larynx Glottic: Full true vocal cord mobility without lesion or mass Supraglottic: Normal appearing epiglottis and AE folds Interarytenoid Space: Moderate pachydermia&edema Subglottic Space: Patent without lesion or edema Neck Neck and Trachea: Midline trachea without mass or lesion Thyroid : No mass or nodularity Lymphatics: No lymphadenopathy  Procedure: Preoperative diagnosis: chronic cough dysphonia  Postoperative diagnosis:   Same + GERD LPR  Procedure: Flexible fiberoptic laryngoscopy  Surgeon: Gerrad Welker, MD  Anesthesia: Topical lidocaine and Afrin Complications: None Condition is stable throughout exam  Indications and consent:  The patient presents to the clinic with above symptoms. Indirect laryngoscopy view was incomplete. Thus it was recommended that they undergo a flexible fiberoptic laryngoscopy. All of the risks, benefits, and potential complications were  reviewed with the patient preoperatively and verbal informed consent was obtained.  Procedure: The patient was seated upright in the clinic. Topical lidocaine and Afrin were applied to the nasal cavity. After adequate anesthesia had occurred, I then proceeded to pass the flexible telescope into the nasal cavity. The nasal cavity was patent without rhinorrhea or polyp. The nasopharynx was also patent without mass or lesion. The base of tongue was visualized and was normal. There were no signs of pooling of secretions in the piriform sinuses. The true vocal folds were mobile bilaterally. There were no signs of glottic or supraglottic mucosal  lesion or mass. There was moderate interarytenoid pachydermia and post cricoid edema. The telescope was then slowly withdrawn and the patient tolerated the procedure throughout.   PROCEDURE NOTE: nasal endoscopy  Preoperative diagnosis: chronic nasal congestion symptoms  Postoperative diagnosis: same  Procedure: Diagnostic nasal endoscopy (62130)  Surgeon: Artice Last, M.D.  Anesthesia: Topical lidocaine and Afrin  H&P REVIEW: The patient's history and physical were reviewed today prior to procedure. All medications were reviewed and updated as well. Complications: None Condition is stable throughout exam Indications and consent: The patient presents with symptoms of chronic sinusitis not responding to previous therapies. All the risks, benefits, and potential complications were reviewed with the patient preoperatively and informed consent was obtained. The time out was completed with confirmation of the correct procedure.   Procedure: The patient was seated upright in the clinic. Topical lidocaine and Afrin were applied to the nasal cavity. After adequate anesthesia had occurred, the rigid nasal endoscope was passed into the nasal cavity. The nasal mucosa, turbinates, septum, and sinus drainage pathways were visualized bilaterally. This revealed no purulence or significant secretions that might be cultured. There were no polyps or sites of significant inflammation. The mucosa was intact and there no crusting present. The scope was then slowly withdrawn and the patient tolerated the procedure well. There were no complications or blood loss.  Studies Reviewed: CT chest 12/18/22 FINDINGS: Cardiovascular: Atherosclerotic calcification of the aorta with age advanced involvement of all 3 coronary arteries. Enlarged pulmonic trunk and heart. No pericardial effusion.   Mediastinum/Nodes: Right thyroidectomy. No pathologically enlarged mediastinal or axillary lymph nodes. Hilar regions are  difficult to definitively evaluate without IV contrast. Esophagus is grossly unremarkable.   Lungs/Pleura: Centrilobular emphysema. Basilar pulmonary parenchymal scarring. Calcified granulomas. 6.5 mm medial left lower lobe nodule, present on 02/14/2009. 4.3 mm juxtapleural nodule in the right middle lobe. No suspicious pulmonary nodules. No pleural fluid. Minimal adherent debris in the airway.   Upper Abdomen: Gastric bypass. Visualized portions of the liver, adrenal glands, kidneys, spleen, pancreas, stomach and bowel are otherwise grossly unremarkable. No upper abdominal adenopathy.   Musculoskeletal: Degenerative changes in the spine. Osteopenia. Flowing anterior osteophytosis.   IMPRESSION: 1. Lung-RADS 2, benign appearance or behavior. Continue annual screening with low-dose chest CT without contrast in 12 months. 2. Age advanced three-vessel coronary artery calcification. 3.  Aortic atherosclerosis (ICD10-I70.0). 4. Enlarged pulmonic trunk, indicative of pulmonary arterial hypertension. 5.  Emphysema (ICD10-J43.9).  CXR 02/18/23 FINDINGS: The heart size and mediastinal contours are within normal limits. Surgical clips are seen overlying the expected region of the thyroid  gland. There is no evidence of an acute infiltrate, pleural effusion or pneumothorax. Multilevel degenerative changes are seen throughout the thoracic spine.   IMPRESSION: No active cardiopulmonary disease.  CT head 02/18/23     Assessment/Plan: Encounter Diagnoses  Name Primary?   Tobacco use disorder Yes   COPD GOLD ? /  AB    Chronic cough    Chronic GERD    Environmental and seasonal allergies    Post-nasal drip    Hypertrophy of both inferior nasal turbinates    Nasal septal deviation    Dysphonia    Hoarseness     Assessment and Plan Assessment & Plan Chronic cough sometimes productive Hx of COPD and current smoking Chronic cough likely multifactorial due to upper airway cough  syndrome, GERD, smoking, PND and COPD. No vocal cord paralysis or growths/lesions on scope exam. Clear nasal drainage and septal deviation/ITH without obstruction noted on scope exam. No tumors or masses on vocal cords. - Prescribed Flonase nasal spray twice daily. - Prescribed Xyzal at night. - Continue omeprazole . - Recommend Reflux Gourmet supplement for GERD. - Consider allergy testing to exclude allergies as a cause of drainage. - Continue efforts for smoking cessation. - Follow up with pulmonary doctor for COPD management.  Upper airway cough syndrome Upper airway cough syndrome likely due to postnasal drainage and silent reflux, contributing to chronic cough. Chronic nasal congestion and post-nasal drainage Evidence of post-nasal drainage during flexible scope exam today, could be contributing to her sx - trial of Xyzal 5 mg daily mg daily and Flonase 2 puffs b/l nares BID - consider nasal saline rinses    Gastroesophageal reflux disease (GERD) GERD with changes on scope exam consistent with reflux, contributing to chronic cough. Reflux management is crucial. - Continue Omeprazole  and Pepcid    -  Reflux Gourmet after meals - diet and lifestyle changes to minimize GERD - Refer to [employer] blog for dietary and lifestyle modifications/reflux cook book  Chronic obstructive pulmonary disease (COPD) COPD with ongoing smoking. Chronic cough may be partially attributed to COPD. - continue current management   RTC PRN   Thank you for allowing me to participate in the care of this patient. Please do not hesitate to contact me with any questions or concerns.   Artice Last, MD Otolaryngology [facility] Phone: [phone] Fax: [phone]    05/22/2023, 8:03 PM

## 2023-05-24 LAB — METHYLMALONIC ACID, SERUM: Methylmalonic Acid, Quantitative: 169 nmol/L (ref 0–378)

## 2023-05-27 NOTE — Progress Notes (Addendum)
 VIRTUAL VISIT via TELEPHONE NOTE [facility]   I connected with Gabriella Hodges  on 05/28/23 at  2:30 PM by telephone and verified that I am speaking with the correct person using two identifiers.  Location: Patient: Home Provider: [facility]   I discussed the limitations, risks, security and privacy concerns of performing an evaluation and management service by telephone and the availability of in person appointments. I also discussed with the patient that there may be a patient responsible charge related to this service. The patient expressed understanding and agreed to proceed.  REASON FOR VISIT:  Follow-up for iron  deficiency anemia   CURRENT THERAPY: Intermittent IV iron   INTERVAL HISTORY:   Gabriella Hodges 63 y.o. female returns for routine follow-up of iron  deficiency anemia.  She was last seen in clinic on 11/26/2022 by Sheril Dines PA-C.  (Her last IV iron  was with Venofer  300 mg x 2 doses on 11/21/2021 and 12/26/2021, given with IV Zofran  to ameliorate her nausea.)   She continues to have chronic fatigue, which had not improved after IV iron  in the past.  She has not noticed any recent rectal bleeding or melena.  She reports occasional lightheadedness and chronic headaches.  No pica or syncope.  She has occasional chest pain, and is following with cardiology; she reports intermittent palpitations.  She continues to follow with Dr. Waymond Hailey for COPD and chronic shortness of breath/dyspnea on exertion.  She is taking her vitamin B12 sublingual supplement daily.  She has 60% energy and 60% appetite. She endorses that she is maintaining a stable weight.  ASSESSMENT & PLAN:  1.  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 Mordechai April in November 2023, but this was canceled due to transportation issues.  Patient has not rescheduled this. - She was unable to tolerate  oral iron  tablets due to nausea and indigestion - Most recent Venofer  in December 2023 (tolerates better with IV ZOFRAN ) - She has not noticed any rectal bleeding for the past several months; no recurrence of her burning abdominal pain.  No history of melena. - Chronic fatigue and dyspnea on exertion are at baseline.  No improvement in energy after last IV iron . - Most recent labs (05/22/2023): Hgb 14.1/MCV 96.9, ferritin 77, iron  saturation 19% - PLAN: No IV iron  at this time. - We will repeat labs (CBC/D, ferritin, iron /TIBC) in 6 months followed by office visit.  (If she is able to get her ferritin >100, we would consider switching to annual visits)   2.  Vitamin B12 deficiency - She is taking vitamin B12 (sublingual) 1000 mcg once a day - Most recent B12 levels (05/22/2023): Vitamin B12 346, MMA normal - PLAN: Continue sublingual vitamin B12 1000 mcg daily - We we will recheck B12/MMA at her follow-up visit in 6 months   PLAN SUMMARY: >> Labs in 6 months (CBC/D, ferritin, iron /TIBC, B12, MMA) >> OFFICE visit 1 week after labs   **Last office visit 11/26/2022      REVIEW OF SYSTEMS:   Review of Systems  Constitutional:  Positive for malaise/fatigue. Negative for chills, diaphoresis, fever and weight loss.  Respiratory:  Positive for cough and shortness of breath.   Cardiovascular:  Positive for chest pain and palpitations.  Gastrointestinal:  Positive for nausea. Negative for abdominal pain, blood in stool, melena and vomiting.  Neurological:  Positive for dizziness. Negative for headaches.  Psychiatric/Behavioral:  The patient has insomnia.      PHYSICAL EXAM: (per limitations of virtual telephone visit)  The patient is alert and oriented x 3, exhibiting adequate mentation, good mood, and ability to speak in full sentences and execute sound judgement.  WRAP UP:   I discussed the assessment and treatment plan with the patient. The patient was provided an opportunity to ask  questions and all were answered. The patient agreed with the plan and demonstrated an understanding of the instructions.   The patient was advised to call back or seek an in-person evaluation if the symptoms worsen or if the condition fails to improve as anticipated.  I provided 22 minutes of non-face-to-face time during this encounter, including > 10 minutes of medical discussion.  Sonnie Dusky, PA-C 05/28/23 3:06 PM

## 2023-05-28 ENCOUNTER — Inpatient Hospital Stay (HOSPITAL_BASED_OUTPATIENT_CLINIC_OR_DEPARTMENT_OTHER): Payer: Medicaid Other | Admitting: Physician Assistant

## 2023-05-28 DIAGNOSIS — D508 Other iron deficiency anemias: Secondary | ICD-10-CM | POA: Diagnosis not present

## 2023-05-28 DIAGNOSIS — E538 Deficiency of other specified B group vitamins: Secondary | ICD-10-CM | POA: Diagnosis not present

## 2023-06-23 NOTE — Progress Notes (Unsigned)
 Gabriella Hodges, female    DOB: [birth year]    MRN: [id number]   Brief patient profile:  72  yowf  active smoker  referred to pulmonary clinic in [place]  11/20/2022 by Dr Glady Laming  for doe and cough x 2023  indolent onset and mildly progressive, some better p rx with ventolin  prn     History of Present Illness  11/20/2022  Pulmonary/ 1st office eval/ [name] / [place] [facility]  Chief Complaint  Patient presents with   Establish Care  Dyspnea:  raking leaves x 30 min  vs sev hours prior to onset  Cough:   thick and sometimes green Sleep: sleeps in chair x 8 y  p thyroid surgery for cancer [facility]  SABA use: avg 2 x daily  02: none  Lung cancer screen: referred 11/20/2022  Rec Plan A = Automatic = Always=    Breztri  Take 2 puffs first thing in am and then another 2 puffs about 12 hours later.  Work on inhaler technique:  Plan B = Backup (to supplement plan A, not to replace it) Only use your albuterol  inhaler as a rescue medication  For cough/ congestion > mucinex or mucinex dm  up to maximum of  1200 mg every 12 hours as needed  Omeprzole 40 mg   Take  30-60 min before first meal of the day and Pepcid  (famotidine )  20 mg after supper until return to office      12/18/22 LDSCT  RADS 2/ Centrilobular emphysema   Admit date: 02/18/2023 Discharge date: 02/19/2023 Recommendations for Outpatient Follow-up:  Follow up with PCP in 1-2 weeks, repeat BMP in 1 week. Continue on Tamiflu  with complete course of treatment Given 1 dose of fosfomycin for UTI prior to discharge. Continue other home medications as prior   Diet recommendation: Heart Healthy   Brief/Interim Summary: Gabriella Hodges is a 63 y.o. female with medical history significant of vertigo, seizure disorder, iron  deficiency anemia, COPD, tobacco usage, and hypothyroidism.Patient presented to the ED on 2/8 with complaints of headache, sore throat, productive cough with green sputum for several days.  She has also had dysuria  which apparently is a chronic problem but has no history of recurrent UTI.  She was admitted for acute COPD exacerbation secondary to influenza A infection along with concerns for UTI.  She is overall doing much better and did not require any oxygen supplementation during the course of this admission.  Her lung sounds are back to baseline and she continues to have some mild dysuria for which she will be given fosfomycin prior to discharge.  No other acute events or concerns noted aside from some hypokalemia which has now resolved.  Etiology for this is unclear and she will need to follow-up with PCP to repeat BMP in 1 week.    03/18/2023  f/u ov/ office/[name] on LDSCT/  back to baseline = dry cough ever since thyroid surgery in 1990s maint on  breztri /ppi q am  Chief Complaint  Patient presents with   Follow-up    3 month follow up   Dyspnea:  more tired than doe  Cough: non-productive  Sleeping:  no resp cc upright x years  SABA use: one twice a week 02: none   Rec Ok to try taking breztri  one twice daily  Only use your albuterol  as a rescue medication  Start taking pepcid  20 mg after supper My office will be contacting you by phone for referral to  PFTs> not  done by 06/24/2023  Please schedule a follow up visit in 3 months but call sooner if needed  ENT eval 05/22/23 no specific findings rec  Prescribed Flonase  nasal spray twice daily. - Prescribed Xyzal  at night. - Continue omeprazole . - Recommend Reflux Gourmet supplement for GERD. - Consider allergy testing to exclude allergies as a cause of drainage.   06/24/2023  f/u ov/[place] office/[name] re: presume  maint on breztri    Chief Complaint  Patient presents with   Follow-up    Shob sometimes / mucus sometimes    Dyspnea:  better walking walking to daugher's house and back  Cough: none  Sleeping: sleeping upright x years  s    resp cc  SABA use: not much  02: none   Lung cancer screening: q dec   No  obvious day to day or daytime variability or assoc excess/ purulent sputum or mucus plugs or hemoptysis or cp or chest tightness, subjective wheeze or overt sinus or hb symptoms.    Also denies any obvious fluctuation of symptoms with weather or environmental changes or other aggravating or alleviating factors except as outlined above   No unusual exposure hx or h/o childhood pna/ asthma or knowledge of premature birth.  Current Allergies, Complete Past Medical History, Past Surgical History, Family History, and Social History were reviewed in [employer] record.  ROS  The following are not active complaints unless bolded Hoarseness, sore throat, dysphagia, dental problems, itching, sneezing,  nasal congestion or discharge of excess mucus or purulent secretions, ear ache,   fever, chills, sweats, unintended wt loss or wt gain, classically pleuritic or exertional cp,  orthopnea pnd or arm/hand swelling  or leg swelling, presyncope, palpitations, abdominal pain, anorexia, nausea, vomiting, diarrhea  or change in bowel habits or change in bladder habits, change in stools or change in urine, dysuria, hematuria,  rash, arthralgias, visual complaints, headache, numbness, weakness or ataxia or problems with walking or coordination,  change in mood or  memory.        Current Meds  Medication Sig   Budeson-Glycopyrrol-Formoterol (BREZTRI  AEROSPHERE) 160-9-4.8 MCG/ACT AERO Take 2 puffs first thing in am and then another 2 puffs about 12 hours later.   docusate sodium  (COLACE) 100 MG capsule Take 100 mg by mouth daily.   famotidine  (PEPCID ) 20 MG tablet One after supper   fluticasone  (FLONASE ) 50 MCG/ACT nasal spray Place 2 sprays into both nostrils 2 (two) times daily.   ibuprofen (ADVIL) 200 MG tablet Take 200-800 mg by mouth every 6 (six) hours as needed for moderate pain (pain score 4-6).   levocetirizine (XYZAL  ALLERGY 24HR) 5 MG tablet Take 1 tablet (5 mg total) by mouth every  evening.   levothyroxine  (SYNTHROID ) 200 MCG tablet Take 200 mcg by mouth daily before breakfast.   omeprazole  (PRILOSEC) 40 MG capsule Take 1 capsule (40 mg total) by mouth daily.   PHENobarbital  (LUMINAL) 97.2 MG tablet Take 97.2 mg by mouth at bedtime.     traMADol  (ULTRAM ) 50 MG tablet Take 50 mg by mouth every 4 (four) hours as needed.   VENTOLIN  HFA 108 (90 Base) MCG/ACT inhaler Inhale 2 puffs into the lungs every 4 (four) hours as needed.               Past Medical History:  Diagnosis Date   Anxiety    Epilepsy (HCC)    GERD (gastroesophageal reflux disease)    Hypothyroidism    Thyroid disease  Objective:    Wts  06/24/2023        162   03/18/2023        164   02/18/23 165 lb (74.8 kg)  11/26/22 173 lb 11.6 oz (78.8 kg)  11/20/22 175 lb (79.4 kg)    Vital signs reviewed  06/24/2023  - Note at rest 02 sats  96% on RA   General appearance:    somber amb wf nad   HEENT : Oropharynx  clear   Nasal turbinates nl    NECK :  without  apparent JVD/ palpable Nodes/TM    LUNGS: no acc muscle use,  Mild barrel  contour chest wall with bilateral  Distant bs s audible wheeze and  without cough on insp or exp maneuvers  and mild  Hyperresonant  to  percussion bilaterally     CV:  RRR  no s3 or murmur or increase in P2, and no edema   ABD:  soft and nontender    MS:  Nl gait/ ext warm without deformities Or obvious joint restrictions  calf tenderness, cyanosis or clubbing     SKIN: warm and dry without lesions    NEURO:  alert, approp, nl sensorium with  no motor or cerebellar deficits apparent.            Assessment

## 2023-06-24 ENCOUNTER — Encounter: Payer: Self-pay | Admitting: Internal Medicine

## 2023-06-24 ENCOUNTER — Ambulatory Visit (INDEPENDENT_AMBULATORY_CARE_PROVIDER_SITE_OTHER): Admitting: Internal Medicine

## 2023-06-24 VITALS — BP 144/77 | HR 72 | Ht 63.0 in | Wt 162.2 lb

## 2023-06-24 DIAGNOSIS — F17210 Nicotine dependence, cigarettes, uncomplicated: Secondary | ICD-10-CM | POA: Diagnosis not present

## 2023-06-24 DIAGNOSIS — R058 Other specified cough: Secondary | ICD-10-CM | POA: Diagnosis not present

## 2023-06-24 DIAGNOSIS — J449 Chronic obstructive pulmonary disease, unspecified: Secondary | ICD-10-CM

## 2023-06-24 NOTE — Assessment & Plan Note (Signed)
 Referred for LCS 11/20/2022 >>>  - Due for LDSCT q November   F/u here in 3 m with pfts          Each maintenance medication was reviewed in detail including emphasizing most importantly the difference between maintenance and prns and under what circumstances the prns are to be triggered using an action plan format where appropriate.  Total time for H and P, chart review, counseling, reviewing hfa device(s) and generating customized AVS unique to this office visit / same day charting = 20 min      h

## 2023-06-24 NOTE — Assessment & Plan Note (Signed)
 Onset p thyroid surgery in the 1990s  - refer to [facility] 03/18/2023 >>> ENT eval 05/22/23 neg rec  obst/rx for pnds/gerd   Seems a lot better today  s Pseudowheeze > no change rx

## 2023-06-24 NOTE — Patient Instructions (Signed)
 No change in medications  The key is to stop smoking completely before smoking completely stops you!   Please schedule a follow up visit in 3 months but call sooner if needed with \

## 2023-06-24 NOTE — Assessment & Plan Note (Signed)
 Active smoker -  11/20/2022  After extensive coaching inhaler device,  effectiveness =    60% (short ti) > try breztri  and approp saba  -  12/18/22 LDSCT  RADS 2/ Centrilobular emphysema  - 03/18/2023  After extensive coaching inhaler device,  effectiveness =    60% hfa > continue breztri    Group D (now reclassified as E) in terms of symptom/risk and laba/lama/ICS  therefore appropriate rx at this point >>>  continue breztri  2bid and approp saba

## 2023-08-05 ENCOUNTER — Other Ambulatory Visit (INDEPENDENT_AMBULATORY_CARE_PROVIDER_SITE_OTHER): Payer: Self-pay

## 2023-08-05 ENCOUNTER — Encounter: Payer: Self-pay | Admitting: Orthopedic Surgery

## 2023-08-05 ENCOUNTER — Ambulatory Visit: Admitting: Orthopedic Surgery

## 2023-08-05 DIAGNOSIS — G8929 Other chronic pain: Secondary | ICD-10-CM

## 2023-08-05 DIAGNOSIS — M1711 Unilateral primary osteoarthritis, right knee: Secondary | ICD-10-CM

## 2023-08-05 MED ORDER — METHYLPREDNISOLONE ACETATE 40 MG/ML IJ SUSP
40.0000 mg | Freq: Once | INTRAMUSCULAR | Status: AC
  Administered 2023-08-05: 40 mg via INTRA_ARTICULAR

## 2023-08-05 NOTE — Progress Notes (Signed)
   There were no vitals taken for this visit.  There is no height or weight on file to calculate BMI.  Chief Complaint  Patient presents with   Knee Pain    Right    No diagnosis found.  DOI/DOS/ Date: ongoing  Worse

## 2023-08-05 NOTE — Progress Notes (Signed)
 Chief Complaint  Patient presents with   Knee Pain    Right   Recurrent pain right knee  63 year old female previously seen for right knee pain with osteoarthritis  We were able to get her symptoms under control with a knee injection back in March however she twisted the knee and started having pain again  Up until that point she was able to ambulate for exercise  Examination of the right knee shows a moderately large effusion.  She has a slight flexion contracture of 5 to 7 degrees she can flex the knee up to 95 degrees and then it gets tight.  She has a stable knee  Imaging studies  DG Knee AP/LAT W/Sunrise Right Result Date: 08/05/2023 X-ray right knee Pain right knee after twisting injury 3 views right knee significant arthritis medial compartment with grade 4 disease this is noted with surrounding osteophytes with medial compartment near bone-on-bone.  The varus deformity is less than 10 degrees Impression grade 4 disease with varus deformity     Aspiration injection was performed with the patient's consent  Procedure note injection and aspiration right knee joint  Verbal consent was obtained to aspirate and inject the right knee joint   Timeout was completed to confirm the site of aspiration and injection  An 18-gauge needle was used to aspirate the knee joint from a suprapatellar lateral approach.  The medications used were 40 mg of Depo-Medrol  and 1% lidocaine 3 cc  Anesthesia was provided by ethyl chloride and the skin was prepped with alcohol.  After cleaning the skin with alcohol an 18-gauge needle was used to aspirate the right knee joint.  We obtained 25 cc of fluid clear yellow  We follow this by injection of 40 mg of Depo-Medrol  and 3 cc 1% lidocaine.  There were no complications. A sterile bandage was applied.   Continue ice modified activities return if no improvement

## 2023-08-13 ENCOUNTER — Other Ambulatory Visit: Payer: Self-pay | Admitting: Orthopedic Surgery

## 2023-08-13 ENCOUNTER — Telehealth: Payer: Self-pay | Admitting: Orthopedic Surgery

## 2023-08-13 DIAGNOSIS — M1711 Unilateral primary osteoarthritis, right knee: Secondary | ICD-10-CM

## 2023-08-13 MED ORDER — DICLOFENAC POTASSIUM 50 MG PO TABS: 50.0000 mg | ORAL_TABLET | Freq: Two times a day (BID) | ORAL | 0 refills | Status: DC

## 2023-08-13 NOTE — Telephone Encounter (Signed)
 Dr. Areatha pt - pt lvm stating she was seen on 08/05/23 and that Dr. Margrette was going to send her something in for her arthritis and nothing has been called in.

## 2023-08-14 ENCOUNTER — Telehealth: Payer: Self-pay | Admitting: Orthopedic Surgery

## 2023-08-14 NOTE — Telephone Encounter (Signed)
 Patient has medicaid she can not have Diclofenac   Its not preferred drug Sulindac Ibuprofen Meloxicam Naproxen

## 2023-08-14 NOTE — Telephone Encounter (Signed)
 I called her told her medicaid will not cover the Diclofenac   Take 800mg  of Ibuprofen q 6 hrs  She voiced understanding

## 2023-08-21 ENCOUNTER — Other Ambulatory Visit: Payer: Self-pay

## 2023-09-07 ENCOUNTER — Emergency Department (HOSPITAL_COMMUNITY)

## 2023-09-07 ENCOUNTER — Emergency Department (HOSPITAL_COMMUNITY)
Admission: EM | Admit: 2023-09-07 | Discharge: 2023-09-07 | Disposition: A | Discharge status: Home / Self Care | Attending: Emergency Medicine | Admitting: Emergency Medicine

## 2023-09-07 ENCOUNTER — Other Ambulatory Visit: Payer: Self-pay

## 2023-09-07 DIAGNOSIS — R1010 Upper abdominal pain, unspecified: Secondary | ICD-10-CM

## 2023-09-07 DIAGNOSIS — Z7951 Long term (current) use of inhaled steroids: Secondary | ICD-10-CM | POA: Diagnosis not present

## 2023-09-07 DIAGNOSIS — R1011 Right upper quadrant pain: Secondary | ICD-10-CM | POA: Insufficient documentation

## 2023-09-07 DIAGNOSIS — J449 Chronic obstructive pulmonary disease, unspecified: Secondary | ICD-10-CM | POA: Insufficient documentation

## 2023-09-07 LAB — URINALYSIS, ROUTINE W REFLEX MICROSCOPIC
Bacteria, UA: NONE SEEN
Bilirubin Urine: NEGATIVE
Glucose, UA: NEGATIVE mg/dL
Ketones, ur: NEGATIVE mg/dL
Leukocytes,Ua: NEGATIVE
Nitrite: NEGATIVE
Protein, ur: NEGATIVE mg/dL
Specific Gravity, Urine: 1.008 (ref 1.005–1.030)
pH: 6 (ref 5.0–8.0)

## 2023-09-07 LAB — MAGNESIUM: Magnesium: 2 mg/dL (ref 1.7–2.4)

## 2023-09-07 LAB — CBC WITH DIFFERENTIAL/PLATELET
Abs Immature Granulocytes: 0.01 K/uL (ref 0.00–0.07)
Basophils Absolute: 0 K/uL (ref 0.0–0.1)
Basophils Relative: 1 %
Eosinophils Absolute: 0.3 K/uL (ref 0.0–0.5)
Eosinophils Relative: 6 %
HCT: 39.3 % (ref 36.0–46.0)
Hemoglobin: 13 g/dL (ref 12.0–15.0)
Immature Granulocytes: 0 %
Lymphocytes Relative: 21 %
Lymphs Abs: 1 K/uL (ref 0.7–4.0)
MCH: 32.6 pg (ref 26.0–34.0)
MCHC: 33.1 g/dL (ref 30.0–36.0)
MCV: 98.5 fL (ref 80.0–100.0)
Monocytes Absolute: 0.4 K/uL (ref 0.1–1.0)
Monocytes Relative: 8 %
Neutro Abs: 3.2 K/uL (ref 1.7–7.7)
Neutrophils Relative %: 64 %
Platelets: 263 K/uL (ref 150–400)
RBC: 3.99 MIL/uL (ref 3.87–5.11)
RDW: 13.2 % (ref 11.5–15.5)
WBC: 4.9 K/uL (ref 4.0–10.5)
nRBC: 0 % (ref 0.0–0.2)

## 2023-09-07 LAB — COMPREHENSIVE METABOLIC PANEL WITH GFR
ALT: 12 U/L (ref 0–44)
AST: 13 U/L — ABNORMAL LOW (ref 15–41)
Albumin: 3.1 g/dL — ABNORMAL LOW (ref 3.5–5.0)
Alkaline Phosphatase: 104 U/L (ref 38–126)
Anion gap: 10 (ref 5–15)
BUN: 15 mg/dL (ref 8–23)
CO2: 23 mmol/L (ref 22–32)
Calcium: 8.5 mg/dL — ABNORMAL LOW (ref 8.9–10.3)
Chloride: 106 mmol/L (ref 98–111)
Creatinine, Ser: 0.57 mg/dL (ref 0.44–1.00)
GFR, Estimated: >60 mL/min (ref >60)
Glucose, Bld: 95 mg/dL (ref 70–99)
Potassium: 3.4 mmol/L — ABNORMAL LOW (ref 3.5–5.1)
Sodium: 139 mmol/L (ref 135–145)
Total Bilirubin: 0.3 mg/dL (ref 0.0–1.2)
Total Protein: 5.8 g/dL — ABNORMAL LOW (ref 6.5–8.1)

## 2023-09-07 LAB — LACTIC ACID, PLASMA
Lactic Acid, Venous: 1 mmol/L (ref 0.5–1.9)
Lactic Acid, Venous: 0.5 mmol/L (ref 0.5–1.9)

## 2023-09-07 LAB — TROPONIN I (HIGH SENSITIVITY)
Troponin I (High Sensitivity): 4 ng/L (ref <18)
Troponin I (High Sensitivity): 4 ng/L (ref <18)

## 2023-09-07 LAB — LIPASE, BLOOD: Lipase: 26 U/L (ref 11–51)

## 2023-09-07 MED ORDER — HYDROMORPHONE HCL 1 MG/ML IJ SOLN
0.5000 mg | Freq: Once | INTRAMUSCULAR | Status: AC
  Administered 2023-09-07: 0.5 mg via INTRAVENOUS
  Filled 2023-09-07: qty 0.5

## 2023-09-07 MED ORDER — ONDANSETRON HCL 4 MG/2ML IJ SOLN
4.0000 mg | Freq: Four times a day (QID) | INTRAMUSCULAR | Status: DC | PRN
  Administered 2023-09-07: 4 mg via INTRAVENOUS
  Filled 2023-09-07: qty 2

## 2023-09-07 MED ORDER — GADOBUTROL 1 MMOL/ML IV SOLN
7.5000 mL | Freq: Once | INTRAVENOUS | Status: AC | PRN
  Administered 2023-09-07: 7.5 mL via INTRAVENOUS

## 2023-09-07 MED ORDER — LACTATED RINGERS IV BOLUS
1000.0000 mL | Freq: Once | INTRAVENOUS | Status: AC
  Administered 2023-09-07: 1000 mL via INTRAVENOUS

## 2023-09-07 MED ORDER — IOHEXOL 300 MG/ML  SOLN
100.0000 mL | Freq: Once | INTRAMUSCULAR | Status: AC | PRN
  Administered 2023-09-07: 100 mL via INTRAVENOUS

## 2023-09-07 NOTE — Discharge Instructions (Signed)
 Your test results today are reassuring.  Continue taking your stomach medication.  Return to the emergency department for any new or worsening symptoms of concern.

## 2023-09-07 NOTE — ED Provider Notes (Signed)
 [place] [facility] AT [facility] Provider Note   CSN: [id number] Arrival date & time: 09/07/23  9343     Patient presents with: Abdominal Pain   Gabriella Hodges is a 63 y.o. female.   HPI Patient presents for abdominal pain.  Medical history includes GERD, COPD, anemia, seizures, anxiety.  She has had prior cholecystectomy.  Starting last night, she had onset of right upper quadrant abdominal pain.  This pain has worsened this morning.  It is currently 10/10 in severity.  She has had nausea without vomiting.  She did take an antiemetic at home.  Last meal and last bowel movement were last night.    Prior to Admission medications   Medication Sig Start Date End Date Taking? Authorizing Provider  diclofenac  (CATAFLAM ) 50 MG tablet Take 1 tablet (50 mg total) by mouth 2 (two) times daily. 08/13/23   Margrette Taft BRAVO, MD  Budeson-Glycopyrrol-Formoterol (BREZTRI  AEROSPHERE) 160-9-4.8 MCG/ACT AERO Take 2 puffs first thing in am and then another 2 puffs about 12 hours later. 11/20/22   Darlean Ozell NOVAK, MD  docusate sodium  (COLACE) 100 MG capsule Take 100 mg by mouth daily.    [provider]  famotidine  (PEPCID ) 20 MG tablet One after supper 03/18/23   Wert, Michael B, MD  fluticasone  (FLONASE ) 50 MCG/ACT nasal spray Place 2 sprays into both nostrils 2 (two) times daily. 05/22/23   Soldatova, Liuba, MD  ibuprofen (ADVIL) 200 MG tablet Take 200-800 mg by mouth every 6 (six) hours as needed for moderate pain (pain score 4-6).    [provider]  levocetirizine (XYZAL  ALLERGY 24HR) 5 MG tablet Take 1 tablet (5 mg total) by mouth every evening. 05/22/23   Soldatova, Liuba, MD  levothyroxine  (SYNTHROID ) 200 MCG tablet Take 200 mcg by mouth daily before breakfast.    [provider]  omeprazole  (PRILOSEC) 40 MG capsule Take 1 capsule (40 mg total) by mouth daily. 12/20/11   Floretta Bethanne CROME, MD  PHENobarbital  (LUMINAL) 97.2 MG tablet Take 97.2 mg  by mouth at bedtime.      [provider]  traMADol  (ULTRAM ) 50 MG tablet Take 50 mg by mouth every 4 (four) hours as needed. 03/07/23   [provider]  VENTOLIN  HFA 108 (90 Base) MCG/ACT inhaler Inhale 2 puffs into the lungs every 4 (four) hours as needed. 11/07/22   [provider]    Allergies: Shellfish-derived products, Nsaids, Penicillins, and Sulfa antibiotics    Review of Systems  Gastrointestinal:  Positive for abdominal pain and nausea.  All other systems reviewed and are negative.   Updated Vital Signs BP (!) 142/68   Pulse (!) 57   Temp 97.9 F (36.6 C) (Oral)   Resp 14   SpO2 98%   Physical Exam Vitals and nursing note reviewed.  Constitutional:      General: She is not in acute distress.    Appearance: Normal appearance. She is well-developed. She is not ill-appearing, toxic-appearing or diaphoretic.  HENT:     Head: Normocephalic and atraumatic.     Right Ear: External ear normal.     Left Ear: External ear normal.     Nose: Nose normal.     Mouth/Throat:     Mouth: Mucous membranes are moist.  Eyes:     Extraocular Movements: Extraocular movements intact.     Conjunctiva/sclera: Conjunctivae normal.  Cardiovascular:     Rate and Rhythm: Normal rate and regular rhythm.  Pulmonary:  Effort: Pulmonary effort is normal. No respiratory distress.     Breath sounds: Normal breath sounds. No wheezing or rales.  Abdominal:     General: There is no distension.     Palpations: Abdomen is soft.     Tenderness: There is abdominal tenderness. There is no guarding or rebound.  Musculoskeletal:        General: No swelling. Normal range of motion.     Cervical back: Normal range of motion and neck supple.  Skin:    General: Skin is warm and dry.  Neurological:     General: No focal deficit present.     Mental Status: She is alert and oriented to person, place, and time.  Psychiatric:        Mood and Affect: Mood normal.         Behavior: Behavior normal.     (all labs ordered are listed, but only abnormal results are displayed) Labs Reviewed  COMPREHENSIVE METABOLIC PANEL WITH GFR - Abnormal; Notable for the following components:      Result Value   Potassium 3.4 (*)    Calcium 8.5 (*)    Total Protein 5.8 (*)    Albumin 3.1 (*)    AST 13 (*)    All other components within normal limits  URINALYSIS, ROUTINE W REFLEX MICROSCOPIC - Abnormal; Notable for the following components:   Color, Urine STRAW (*)    Hgb urine dipstick SMALL (*)    All other components within normal limits  LIPASE, BLOOD  CBC WITH DIFFERENTIAL/PLATELET  MAGNESIUM   LACTIC ACID, PLASMA  LACTIC ACID, PLASMA  TROPONIN I (HIGH SENSITIVITY)  TROPONIN I (HIGH SENSITIVITY)    EKG: None  Radiology: MR ABDOMEN MRCP W WO CONTAST Result Date: 09/07/2023 EXAM: MRCP WITH AND WITHOUT IV CONTRAST 09/07/2023 12:08:36 PM TECHNIQUE: Multisequence, multiplanar magnetic resonance images of the abdomen with and without intravenous contrast. High resolution T2 images of the biliary tree and pancreatic duct were obtained with sagittal and coronal slab MIP images. 3D MRCP were created on independent workstation. IV CONTRAST: 7.5 mL of gadobutrol  (GADAVIST ) 1 MMOL/ML injection 7.5 mL GADOBUTROL  1 MMOL/ML IV SOLN COMPARISON: CT abdomen and pelvis from 09/07/2023. CLINICAL HISTORY: RUQ abdominal pain, biliary disease suspected, US  nondiagnostic. Pt arrived via [facility] from home c/o abd pain, mid, above the umbilicus. States LBM was yesterday and did not appear abnormal to her. Rates pain 10/10 and stabbing in nature. FINDINGS: MRCP: Mild intrahepatic bile duct dilatation and common bile duct dilatation. The CBD measures up to 1.2 cm. No signs of choledocholithiasis. LOWER CHEST: No pleural effusion. No consolidation. LIVER: Normal size, contour and signal. No fatty infiltration. No suspicious enhancing liver lesions. Small scattered liver cysts. GALLBLADDER: Status  post cholecystectomy. PANCREAS: Pancreatic atrophy. No main duct dilatation or mass identified. SPLEEN: Normal size. No focal lesion. ADRENAL GLANDS: Normal appearance. No mass. KIDNEYS AND URETERS: Normal size and contour. No hydronephrosis. VISUALIZED GI TRACT: No dilated loops of bowel or signs of bowel inflammation. Moderate stool burden noted within the colon. LYMPH NODES: No enlarged abdominal lymph nodes. VASULATURE: Aortic atherosclerotic changes. Normal caliber IVC. PERITONEUM: No significant free fluid or focal fluid collections. ABDOMINAL WALL: Fat-containing periumbilical hernia noted, image 35/8. BONES: Lumbar spondylosis. Age indeterminate L4 compression deformity is identified with increased T2 signal localizing to the L3 and L4 vertebra concerning for subacute fracture. IMPRESSION: 1. Status post cholecystectomy with mild intrahepatic and common bile duct dilatation (CBD up to 1.2 cm). No signs of choledocholithiasis.  2. Pancreatic atrophy without main duct dilatation or mass. 3. Moderate stool burden within the colon. 4. Age indeterminate L4 compression deformity with increased T2 signal at L3 and L4, concerning for subacute fractures. Electronically signed by: Waddell Calk MD 09/07/2023 12:45 PM EDT RP Workstation: [id number]   MR 3D Recon At Scanner Result Date: 09/07/2023 EXAM: MRCP WITH AND WITHOUT IV CONTRAST 09/07/2023 12:08:36 PM TECHNIQUE: Multisequence, multiplanar magnetic resonance images of the abdomen with and without intravenous contrast. High resolution T2 images of the biliary tree and pancreatic duct were obtained with sagittal and coronal slab MIP images. 3D MRCP were created on independent workstation. IV CONTRAST: 7.5 mL of gadobutrol  (GADAVIST ) 1 MMOL/ML injection 7.5 mL GADOBUTROL  1 MMOL/ML IV SOLN COMPARISON: CT abdomen and pelvis from 09/07/2023. CLINICAL HISTORY: RUQ abdominal pain, biliary disease suspected, US  nondiagnostic. Pt arrived via [facility] from home c/o abd pain,  mid, above the umbilicus. States LBM was yesterday and did not appear abnormal to her. Rates pain 10/10 and stabbing in nature. FINDINGS: MRCP: Mild intrahepatic bile duct dilatation and common bile duct dilatation. The CBD measures up to 1.2 cm. No signs of choledocholithiasis. LOWER CHEST: No pleural effusion. No consolidation. LIVER: Normal size, contour and signal. No fatty infiltration. No suspicious enhancing liver lesions. Small scattered liver cysts. GALLBLADDER: Status post cholecystectomy. PANCREAS: Pancreatic atrophy. No main duct dilatation or mass identified. SPLEEN: Normal size. No focal lesion. ADRENAL GLANDS: Normal appearance. No mass. KIDNEYS AND URETERS: Normal size and contour. No hydronephrosis. VISUALIZED GI TRACT: No dilated loops of bowel or signs of bowel inflammation. Moderate stool burden noted within the colon. LYMPH NODES: No enlarged abdominal lymph nodes. VASULATURE: Aortic atherosclerotic changes. Normal caliber IVC. PERITONEUM: No significant free fluid or focal fluid collections. ABDOMINAL WALL: Fat-containing periumbilical hernia noted, image 35/8. BONES: Lumbar spondylosis. Age indeterminate L4 compression deformity is identified with increased T2 signal localizing to the L3 and L4 vertebra concerning for subacute fracture. IMPRESSION: 1. Status post cholecystectomy with mild intrahepatic and common bile duct dilatation (CBD up to 1.2 cm). No signs of choledocholithiasis. 2. Pancreatic atrophy without main duct dilatation or mass. 3. Moderate stool burden within the colon. 4. Age indeterminate L4 compression deformity with increased T2 signal at L3 and L4, concerning for subacute fractures. Electronically signed by: Waddell Calk MD 09/07/2023 12:45 PM EDT RP Workstation: [id number]   CT ABDOMEN PELVIS W CONTRAST Result Date: 09/07/2023 CLINICAL DATA:  62 year old female with acute abdominal pain. EXAM: CT ABDOMEN AND PELVIS WITH CONTRAST TECHNIQUE: Multidetector CT imaging of  the abdomen and pelvis was performed using the standard protocol following bolus administration of intravenous contrast. RADIATION DOSE REDUCTION: This exam was performed according to the departmental dose-optimization program which includes automated exposure control, adjustment of the mA and/or kV according to patient size and/or use of iterative reconstruction technique. CONTRAST:  OMNIPAQUE  IOHEXOL  300 MG/ML  SOLN COMPARISON:  CT Abdomen and Pelvis 02/14/2009. Right upper quadrant ultrasound today reported separately. FINDINGS: Lower chest: Calcified coronary artery atherosclerosis and/or stent (series 2, image 3). Stable cardiac size since 2011, upper limits of normal. No pericardial or pleural effusion. Mild chronic lung base atelectasis and scarring. And benign subcentimeter lung nodule in the posterior basal segment of the left lower lobe which is stable since 2011 (no follow-up imaging recommended). Hepatobiliary: Chronically absent gallbladder. CBD enlargement which is similar to the 2011 CT. See ultrasound today reported separately. No suspicious liver lesion, with several tiny but circumscribed low-density areas in the liver most likely benign  cyst (no follow up imaging recommended). Pancreas: Fatty atrophied pancreas. Spleen: Negative. Adrenals/Urinary Tract: Normal adrenal glands. Nonobstructed kidneys with symmetric renal enhancement and contrast excretion. Diminutive ureters. No nephrolithiasis. Unremarkable urinary bladder. Stomach/Bowel: Highly redundant and gas distended sigmoid colon in the left abdomen, upper quadrant. Decompressed distal sigmoid and rectum. Retained stool in nondilated upstream descending colon, redundant splenic flexure. Similar transverse and right colon retained stool. Cecum on a lax mesentery. No large bowel inflammation identified. Appendix is not delineated. Decompressed small bowel throughout the abdomen and pelvis. Chronic Roux-en-Y type gastric bypass.  Decompressed distal stomach and duodenum. No pneumoperitoneum. No free fluid. No convincing mesenteric inflammation. Vascular/Lymphatic: Aortoiliac calcified atherosclerosis. Mild tortuosity, normal caliber of the abdominal aorta. Major arterial and portal venous structures appear patent. No lymphadenopathy identified. Reproductive: Diminutive, within normal limits. Other: No pelvis free fluid. Musculoskeletal: Hyperostosis related chronic thoracic interbody ankylosis. Osteopenia. 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 By this numbering system L4 superior endplate compression fracture is noted and new since 2011. 20% loss of height. The compressed L4 superior body is sclerotic. Mild retropulsion of the posterosuperior endplate there. Superimposed severe bilateral facet arthropathy at both L4 and L5 with vacuum facet. No other acute osseous abnormality identified. Chronic left pubic rami fractures are also new since 2011, healed. IMPRESSION: 1. Osteopenia with chronic left pubic rami fractures which are new from a 2011 CT. Lower lumbar 20% compression fracture also new since that time but age indeterminate (note transitional lumbosacral anatomy). If there is back pain and specific therapy such as vertebroplasty is desired, Lumbar MRI without contrast or Nuclear Medicine Whole-body Bone Scan would best determine acuity. 2. Chronic Roux-en-Y type gastric bypass. Highly redundant large bowel. No convincing acute bowel obstruction or inflammation. Appendix is not identified. 3. Chronic cholecystectomy. Stable CBD to Ultrasound today reported separately. 4.  Aortic Atherosclerosis (ICD10-I70.0). Electronically Signed   By: VEAR Hurst M.D.   On: 09/07/2023 11:50   US  Abdomen Limited Result Date: 09/07/2023 EXAM: Right Upper Quadrant Abdominal Ultrasound 09/07/2023 09:35:51 AM TECHNIQUE: Real-time ultrasonography of the right upper quadrant of the abdomen was  performed. COMPARISON: None available. CLINICAL HISTORY: RUQ pain. FINDINGS: LIVER: The liver demonstrates normal echogenicity. Intrahepatic biliary ductal dilatation noted. No evidence of mass. Portal vein is patent with normal direction towards the liver. BILIARY SYSTEM: Status post cholecystectomy. Common bile duct measures 9.4 mm in diameter. RIGHT KIDNEY: The right kidney is grossly unremarkable in appearances without evidence of hydronephrosis, echogenic calculi or worrisome mass lesions. OTHER: No right upper quadrant ascites. IMPRESSION: 1. Status post cholecystectomy. 2. Intrahepatic bile duct dilatation and common bile duct measuring 9.4 mm in diameter. If there is a clinical concern for biliary obstruction, consider further evaluation with MRCP. Electronically signed by: Waddell Calk MD 09/07/2023 09:50 AM EDT RP Workstation: [id number]   DG Abd Acute W/Chest Result Date: 09/07/2023 EXAM: UPRIGHT AND SUPINE XRAY VIEWS OF THE ABDOMEN AND 4 VIEW(S) OF THE CHEST 09/07/2023 07:28:00 AM COMPARISON: 02/18/2023 CLINICAL HISTORY: RUQ pain. Per chart: Pt arrived via [facility] from home c/o abd pain, mid, above the umbilicus. States LBM was yesterday and did not appear abnormal to her. Rates pain 10/10 and stabbing in nature. FINDINGS: LUNGS AND PLEURA: No consolidation or pulmonary edema. No pleural effusion or pneumothorax. HEART AND MEDIASTINUM: No acute abnormality of the cardiac and mediastinal silhouettes. Surgical clips overlie the right lower neck. BOWEL: Air-filled distended small bowel consistent with ileus. No bowel obstruction. PERITONEUM AND  SOFT TISSUES: No abnormal calcifications. No free air. BONES: Degenerative changes and rightward curvature of the lumbar spine are noted. Thoracic degenerative changes are present. No acute osseous abnormality. IMPRESSION: 1. Air-filled distended small bowel consistent with ileus. Electronically signed by: Lonni Necessary MD 09/07/2023 07:33 AM EDT RP  Workstation: [id number]     Procedures   Medications Ordered in the ED  ondansetron  (ZOFRAN ) injection 4 mg (4 mg Intravenous Given 09/07/23 0726)  HYDROmorphone  (DILAUDID ) injection 0.5 mg (0.5 mg Intravenous Given 09/07/23 0726)  lactated ringers  bolus 1,000 mL (1,000 mLs Intravenous Bolus 09/07/23 0726)  iohexol  (OMNIPAQUE ) 300 MG/ML solution 100 mL (100 mLs Intravenous Contrast Given 09/07/23 1131)  gadobutrol  (GADAVIST ) 1 MMOL/ML injection 7.5 mL (7.5 mLs Intravenous Contrast Given 09/07/23 1204)                                    Medical Decision Making Amount and/or Complexity of Data Reviewed Labs: ordered. Radiology: ordered.  Risk Prescription drug management.   This patient presents to the ED for concern of abdominal pain, this involves an extensive number of treatment options, and is a complaint that carries with it a high risk of complications and morbidity.  The differential diagnosis includes cholelithiasis, choledocholithiasis, PUD, pancreatitis, enteritis, colitis   Co morbidities / Chronic conditions that complicate the patient evaluation  GERD, COPD, anemia, seizures, anxiety   Additional history obtained:  Additional history obtained from EMR External records from outside source obtained and reviewed including N/A   Lab Tests:  I Ordered, and personally interpreted labs.  The pertinent results include: Normal hemoglobin, no leukocytosis, normal kidney function, normal troponin, normal lactate   Imaging Studies ordered:  I ordered imaging studies including x-ray of abdomen and chest, right upper quadrant ultrasound, CT of abdomen pelvis, MRCP I independently visualized and interpreted imaging which showed mild intrahepatic and CBD dilatation without evidence of obstruction. I agree with the radiologist interpretation   Cardiac Monitoring: / EKG:  The patient was maintained on a cardiac monitor.  I personally viewed and interpreted the cardiac  monitored which showed an underlying rhythm of: Sinus rhythm   Problem List / ED Course / Critical interventions / Medication management  Patient presents for abdominal pain.  On arrival in the ED, vital signs are normal.  Patient appears mildly uncomfortable.  Tenderness is present, primarily right upper quadrant.  She does have a history of gallbladder removal.  Dilaudid  was ordered for analgesia.  Workup was initiated.  Patient's lab work was unremarkable.  On right upper and ultrasound, she does have evidence of CBD and intrahepatic ductal dilatation.  CT scan and MRCP were ordered to further evaluate.  On further imaging studies, no obstruction is identified.  She did have resolution of her pain while in the ED.  She does currently take omeprazole  and Pepcid .  She was discharged in stable condition. I ordered medication including IV fluids for hydration, Dilaudid  for analgesia, Zofran  for nausea Reevaluation of the patient after these medicines showed that the patient improved I have reviewed the patients home medicines and have made adjustments as needed   Social Determinants of Health:  Lives independently     Final diagnoses:  Pain of upper abdomen    ED Discharge Orders     None          Melvenia Motto, MD 09/07/23 1354

## 2023-09-07 NOTE — ED Notes (Signed)
 Placed Gold necklace back on pt per pt request

## 2023-09-07 NOTE — ED Triage Notes (Signed)
 Pt arrived via [facility] from home c/o abd pain, mid, above the umbilicus. States LBM was yesterday and did not appear abnormal to her. Rates pain 10/10 and stabbing in nature

## 2023-09-22 NOTE — Progress Notes (Signed)
 Gabriella Hodges, female    DOB: 01-12-1960    MRN: [id number]   Brief patient profile:  7  yowf  active smoker  referred to pulmonary clinic in [place]  11/20/2022 by Dr Marvine  for doe and cough x 2023  indolent onset and mildly progressive, some better p rx with ventolin  prn     History of Present Illness  11/20/2022  Pulmonary/ 1st office eval/ Gabriella Hodges / [place] [facility]  Chief Complaint  Patient presents with   Establish Care  Dyspnea:  raking leaves x 30 min  vs sev hours prior to onset  Cough:   thick and sometimes green Sleep: sleeps in chair x 8 y  p thyroid surgery for cancer [facility]  SABA use: avg 2 x daily  02: none  Lung cancer screen: referred 11/20/2022  Rec Plan A = Automatic = Always=    Breztri  Take 2 puffs first thing in am and then another 2 puffs about 12 hours later.  Work on inhaler technique:  Plan B = Backup (to supplement plan A, not to replace it) Only use your albuterol  inhaler as a rescue medication  For cough/ congestion > mucinex or mucinex dm  up to maximum of  1200 mg every 12 hours as needed  Omeprzole 40 mg   Take  30-60 min before first meal of the day and Pepcid  (famotidine )  20 mg after supper until return to office      12/18/22 LDSCT  RADS 2/ Centrilobular emphysema   Admit date: 02/18/2023 Discharge date: 02/19/2023 Recommendations for Outpatient Follow-up:  Follow up with PCP in 1-2 weeks, repeat BMP in 1 week. Continue on Tamiflu  with complete course of treatment Given 1 dose of fosfomycin for UTI prior to discharge. Continue other home medications as prior   Diet recommendation: Heart Healthy   Brief/Interim Summary: Gabriella Hodges is a 63 y.o. female with medical history significant of vertigo, seizure disorder, iron  deficiency anemia, COPD, tobacco usage, and hypothyroidism.Patient presented to the ED on 2/8 with complaints of headache, sore throat, productive cough with green sputum for several days.  She has also had dysuria  which apparently is a chronic problem but has no history of recurrent UTI.  She was admitted for acute COPD exacerbation secondary to influenza A infection along with concerns for UTI.  She is overall doing much better and did not require any oxygen supplementation during the course of this admission.  Her lung sounds are back to baseline and she continues to have some mild dysuria for which she will be given fosfomycin prior to discharge.  No other acute events or concerns noted aside from some hypokalemia which has now resolved.  Etiology for this is unclear and she will need to follow-up with PCP to repeat BMP in 1 week.    03/18/2023  f/u ov/Gabriella Hodges office/Gabriella Hodges mz:[id number] on LDSCT/  back to baseline = dry cough ever since thyroid surgery in 1990s maint on  breztri /ppi q am  Chief Complaint  Patient presents with   Follow-up    3 month follow up   Dyspnea:  more tired than doe  Cough: non-productive  Sleeping:  no resp cc upright x years  SABA use: one twice a week 02: none   Rec Ok to try taking breztri  one twice daily  Only use your albuterol  as a rescue medication  Start taking pepcid  20 mg after supper My office will be contacting you by phone for referral to  PFTs> not  done by 06/24/2023  Please schedule a follow up visit in 3 months but call sooner if needed  ENT eval 05/22/23 no specific findings rec  Prescribed Flonase  nasal spray twice daily. - Prescribed Xyzal  at night. - Continue omeprazole . - Recommend Reflux Gourmet supplement for GERD. - Consider allergy testing to exclude allergies as a cause of drainage.   06/24/2023  f/u ov/[place] office/Gabriella Hodges re: presumed copd   maint on breztri    Chief Complaint  Patient presents with   Follow-up    Shob sometimes / mucus sometimes    Dyspnea:  better walking to daugher's house and back  Cough: none  Sleeping: sleeping upright x years  s    resp cc  SABA use: not much  02: none  Lung cancer screening: q dec  Rec No  change in medications The key is to stop smoking completely before smoking completely stops you! Please schedule a follow up visit in 3 months but call sooner if needed     09/23/2023  f/u ov/ office/Gabriella Hodges re:  emphysema on CT/presumed copd   maint on Breztri  prn  smoker  Chief Complaint  Patient presents with   Shortness of Breath    In hosp for shob   Dyspnea:  worse with onset of recurrent cp used to come and go (x 5 years) and now and stayed  x 3 weeks  located epigastrium worse p eating each meal regardless of type of food or time of day  Cough: none  Sleeping: stays upright always /never lies down   x years  SABA use: avg use  02: none   Lung cancer screening: due q dec   No obvious day to day or daytime variability or assoc excess/ purulent sputum or mucus plugs or hemoptysis or  chest tightness, subjective wheeze or overt sinus or hb symptoms.    Also denies any obvious fluctuation of symptoms with weather or environmental changes or other aggravating or alleviating factors except as outlined above   No unusual exposure hx or h/o childhood pna/ asthma or knowledge of premature birth.  Current Allergies, Complete Past Medical History, Past Surgical History, Family History, and Social History were reviewed in [employer] record.  ROS  The following are not active complaints unless bolded Hoarseness, sore throat, dysphagia, dental problems, itching, sneezing,  nasal congestion or discharge of excess mucus or purulent secretions, ear ache,   fever, chills, sweats, unintended wt loss or wt gain, classically pleuritic or exertional cp,  orthopnea pnd or arm/hand swelling  or leg swelling, presyncope, palpitations, abdominal pain, anorexia, nausea, vomiting, diarrhea  or change in bowel habits or change in bladder habits, change in stools or change in urine, dysuria, hematuria,  rash, arthralgias, visual complaints, headache, numbness, weakness or ataxia or  problems with walking or coordination,  change in mood or  memory.        Current Meds  Medication Sig   Budeson-Glycopyrrol-Formoterol (BREZTRI  AEROSPHERE) 160-9-4.8 MCG/ACT AERO Take 2 puffs first thing in am and then another 2 puffs about 12 hours later.   [EXPIRED] budesonide -glycopyrrolate-formoterol (BREZTRI  AEROSPHERE) 160-9-4.8 MCG/ACT AERO inhaler Inhale 2 puffs into the lungs in the morning and at bedtime for 1 day.   docusate sodium  (COLACE) 100 MG capsule Take 100 mg by mouth daily.   famotidine  (PEPCID ) 20 MG tablet One after supper   fluticasone  (FLONASE ) 50 MCG/ACT nasal spray Place 2 sprays into both nostrils 2 (two) times daily.   ibuprofen (ADVIL) 200 MG  tablet Take 200-800 mg by mouth every 6 (six) hours as needed for moderate pain (pain score 4-6).   levocetirizine (XYZAL  ALLERGY 24HR) 5 MG tablet Take 1 tablet (5 mg total) by mouth every evening.   levothyroxine  (SYNTHROID ) 200 MCG tablet Take 200 mcg by mouth daily before breakfast.   omeprazole  (PRILOSEC) 40 MG capsule Take 1 capsule (40 mg total) by mouth daily.   PHENobarbital  (LUMINAL) 97.2 MG tablet Take 97.2 mg by mouth at bedtime.     traMADol  (ULTRAM ) 50 MG tablet Take 50 mg by mouth every 4 (four) hours as needed.   VENTOLIN  HFA 108 (90 Base) MCG/ACT inhaler Inhale 2 puffs into the lungs every 4 (four) hours as needed.         Past Medical History:  Diagnosis Date   Anxiety    Epilepsy (HCC)    GERD (gastroesophageal reflux disease)    Hypothyroidism    Thyroid disease     Objective:    Wts  09/23/2023        153  06/24/2023        162   03/18/2023        164   02/18/23 165 lb (74.8 kg)  11/26/22 173 lb 11.6 oz (78.8 kg)  11/20/22 175 lb (79.4 kg)    Vital signs reviewed  09/23/2023  - Note at rest 02 sats  99% on RA   General appearance:    amb somber wf nad / smoker's rattle  HEENT : Oropharynx  clear       NECK :  without  apparent JVD/ palpable Nodes/TM    LUNGS: no acc muscle use,  Mild  barrel  contour chest wall with bilateral  Distant bs s audible wheeze and  without cough on insp or exp maneuvers  and mild  Hyperresonant  to  percussion bilaterally     CV:  RRR  no s3 or murmur or increase in P2, and no edema   ABD:  soft with minimal tenderness over epigastrium and no guarding or rebound.  MS:  Nl gait/ ext warm without deformities Or obvious joint restrictions  calf tenderness, cyanosis or clubbing     SKIN: warm and dry without lesions    NEURO:  alert, approp, nl sensorium with  no motor or cerebellar deficits apparent.            Assessment   Assessment & Plan COPD GOLD ? / AB Active smoker -  11/20/2022  After extensive coaching inhaler device,  effectiveness =    60% (short ti) > try breztri  and approp saba  -  12/18/22 LDSCT  RADS 2/ Centrilobular emphysema  - 03/18/2023  After extensive coaching inhaler device,  effectiveness =    60% hfa > continue breztri   - 09/23/2023  After extensive coaching inhaler device,  effectiveness =    near 0% at baseline  maybe 50% with coaching  - 09/23/2023   Walked on RA  x  2  lap(s) =  approx 300  ft  @  mod  pace, stopped due to tired with lowest 02 sats 97%   and min sob  - PFTs ordered 09/23/2023    Group E in terms of symptoms/risk so  laba/lama/ICS  therefore appropriate rx at this point >>>  continue brezri bi   and approp SABA prn pending pfts then regroup with all meds in hand using a trust but verify approach to confirm accurate Medication  Reconciliation The principal here  is that until we are certain that the  patients are doing what we've asked, it makes no sense to ask them to do more.    Cigarette smoker 5  min discussion re active cigarette smoking in addition to office E&M  Ask about tobacco use:  ongoing  Advise quitting  I took an extended  opportunity with this patient to outline the consequences of continued cigarette use  in airway disorders based on all the data we have from the multiple  national lung health studies (perfomed over decades at millions of dollars in cost)  indicating that smoking cessation, not choice of inhalers or pulmonary physicians, is the most important aspect of her  care.   Assess willingness:  Not committed at this point Assist in quit attempt:  Per PCP when ready Arrange follow up:   Follow up per Primary Care planned      Abdominal pain, epigastric Rec try gas x// max gerd rx/ avoid NSAIDS and just use tyl arthritis/ f/u with GI ASAP   Each maintenance medication was reviewed in detail including emphasizing most importantly the difference between maintenance and prns and under what circumstances the prns are to be triggered using an action plan format where appropriate.  Total time for H and P, chart review, counseling, reviewing hfa device(s) , directly observing portions of ambulatory 02 saturation study/ and generating customized AVS unique to this office visit / same day charting = 28 min            AVS  Patient Instructions  Plan A = Automatic = Always=    Breztri  Take 2 puffs first thing in am and then another 2 puffs about 12 hours later.    Work on inhaler technique:  relax and gently blow all the way out then take a nice smooth full deep breath back in, triggering the inhaler at same time you start breathing in.  Hold breath in for at least  5 seconds if you can. Blow out breztri   thru nose. Rinse and gargle with water when done.  If mouth or throat bother you at all,  try brushing teeth/gums/tongue with arm and hammer toothpaste/ make a slurry and gargle and spit out.      Plan B = Backup (to supplement plan A, not to replace it) Use your albuterol  inhaler as a rescue medication to be used if you can't catch your breath by resting or slowing your pace  or doing a relaxed purse lip breathing pattern.  - The less you use it, the better it will work when you need it. - Ok to use the inhaler up to 2 puffs  every 4 hours if you must but call  for appointment if use goes up over your usual need - Don't leave home without it !!  (think of it like the spare tire or starter fluid for your car)    Try to lie down when pain bothers you to see if it goes away within about 10 minutes and if so it is Gas  -try GAS X   Treatment consists of avoiding foods that cause gas (especially boiled eggs, mexcican food but especially  beans and undercooked vegetables like  spinach and some salads)   - see DR Quintin when you can   Stop ibuprofen and just Tylenol  Arthritis    Please schedule a follow up visit in 3 months but call sooner if needed with PFTs in meantime  Ozell America, MD 09/28/2023

## 2023-09-23 ENCOUNTER — Ambulatory Visit: Admitting: Internal Medicine

## 2023-09-23 ENCOUNTER — Encounter: Payer: Self-pay | Admitting: Internal Medicine

## 2023-09-23 VITALS — BP 158/78 | HR 75 | Ht 63.0 in | Wt 153.0 lb

## 2023-09-23 DIAGNOSIS — R1013 Epigastric pain: Secondary | ICD-10-CM | POA: Diagnosis not present

## 2023-09-23 DIAGNOSIS — R058 Other specified cough: Secondary | ICD-10-CM

## 2023-09-23 DIAGNOSIS — F17210 Nicotine dependence, cigarettes, uncomplicated: Secondary | ICD-10-CM | POA: Diagnosis not present

## 2023-09-23 DIAGNOSIS — J449 Chronic obstructive pulmonary disease, unspecified: Secondary | ICD-10-CM | POA: Diagnosis not present

## 2023-09-23 MED ORDER — BREZTRI AEROSPHERE 160-9-4.8 MCG/ACT IN AERO: 2.0000 | INHALATION_SPRAY | Freq: Two times a day (BID) | RESPIRATORY_TRACT | Status: AC

## 2023-09-23 NOTE — Patient Instructions (Addendum)
 Plan A = Automatic = Always=    Breztri  Take 2 puffs first thing in am and then another 2 puffs about 12 hours later.    Work on inhaler technique:  relax and gently blow all the way out then take a nice smooth full deep breath back in, triggering the inhaler at same time you start breathing in.  Hold breath in for at least  5 seconds if you can. Blow out breztri   thru nose. Rinse and gargle with water when done.  If mouth or throat bother you at all,  try brushing teeth/gums/tongue with arm and hammer toothpaste/ make a slurry and gargle and spit out.      Plan B = Backup (to supplement plan A, not to replace it) Use your albuterol  inhaler as a rescue medication to be used if you can't catch your breath by resting or slowing your pace  or doing a relaxed purse lip breathing pattern.  - The less you use it, the better it will work when you need it. - Ok to use the inhaler up to 2 puffs  every 4 hours if you must but call for appointment if use goes up over your usual need - Don't leave home without it !!  (think of it like the spare tire or starter fluid for your car)    Try to lie down when pain bothers you to see if it goes away within about 10 minutes and if so it is Gas  -try GAS X   Treatment consists of avoiding foods that cause gas (especially boiled eggs, mexcican food but especially  beans and undercooked vegetables like  spinach and some salads)   - see DR Quintin when you can   Stop ibuprofen and just Tylenol  Arthritis    Please schedule a follow up visit in 3 months but call sooner if needed with PFTs in meantime

## 2023-09-28 NOTE — Assessment & Plan Note (Addendum)
 Active smoker -  11/20/2022  After extensive coaching inhaler device,  effectiveness =    60% (short ti) > try breztri  and approp saba  -  12/18/22 LDSCT  RADS 2/ Centrilobular emphysema  - 03/18/2023  After extensive coaching inhaler device,  effectiveness =    60% hfa > continue breztri   - 09/23/2023  After extensive coaching inhaler device,  effectiveness =    near 0% at baseline  maybe 50% with coaching  - 09/23/2023   Walked on RA  x  2  lap(s) =  approx 300  ft  @  mod  pace, stopped due to tired with lowest 02 sats 97%   and min sob  - PFTs ordered 09/23/2023    Group E in terms of symptoms/risk so  laba/lama/ICS  therefore appropriate rx at this point >>>  continue brezri bi   and approp SABA prn pending pfts then regroup with all meds in hand using a trust but verify approach to confirm accurate Medication  Reconciliation The principal here is that until we are certain that the  patients are doing what we've asked, it makes no sense to ask them to do more.

## 2023-09-28 NOTE — Assessment & Plan Note (Addendum)
 5  min discussion re active cigarette smoking in addition to office E&M  Ask about tobacco use:  ongoing  Advise quitting  I took an extended  opportunity with this patient to outline the consequences of continued cigarette use  in airway disorders based on all the data we have from the multiple national lung health studies (perfomed over decades at millions of dollars in cost)  indicating that smoking cessation, not choice of inhalers or pulmonary physicians, is the most important aspect of her  care.   Assess willingness:  Not committed at this point Assist in quit attempt:  Per PCP when ready Arrange follow up:   Follow up per Primary Care planned

## 2023-09-28 NOTE — Assessment & Plan Note (Addendum)
 Rec try gas x// max gerd rx/ avoid NSAIDS and just use tyl arthritis/ f/u with GI ASAP

## 2023-10-16 ENCOUNTER — Telehealth: Payer: Self-pay

## 2023-10-16 NOTE — Telephone Encounter (Signed)
 Will complete at new patient appointment

## 2023-10-16 NOTE — Telephone Encounter (Signed)
 Pt doesn't have appt to establish care until Jan 2026. We cannot complete the surgical clearance until after she is established. Placed form in copy folder at front desk

## 2023-10-16 NOTE — Telephone Encounter (Signed)
 Dental medical clearance form  Noted Copied Sleeved Original placed front desk completed folder Copy placed front desk folder  Patient appt new patient 01.13.2026 and has been added to a waiting list.

## 2023-10-17 ENCOUNTER — Telehealth: Payer: Self-pay | Admitting: *Deleted

## 2023-10-17 NOTE — Telephone Encounter (Signed)
 Dr. Stacia Primary cardiologist. Pt needs in office appt for preop clearance. I will reach out to the scheduling team to see if they can help find an appt for the pt. I viewed the [facility] and [place] office. Pt said [place] would be too far and she said she probably could not get to [place].

## 2023-10-17 NOTE — Telephone Encounter (Signed)
   Pre-operative Risk Assessment    Patient Name: Gabriella Hodges  DOB: [birth year] MRN: [id number]   Date of last office visit: 09/20/2022 Date of next office visit: N/A  Request for Surgical Clearance    Procedure:  Dental Extraction - Amount of Teeth to be Pulled:  11  Date of Surgery:  Clearance TBD                                Surgeon:  GLENDIA EMERSON PRIMROSE, DMD,PA Surgeon's Group or Practice Name:  [facility] Phone number:  [phone] Fax number:  [phone]   Type of Clearance Requested:   - Medical    Type of Anesthesia:  General    Additional requests/questions:    SignedApolinar Essex   10/17/2023, 11:43 AM

## 2023-10-17 NOTE — Telephone Encounter (Signed)
    Primary Cardiologist:None  Chart reviewed as part of pre-operative protocol coverage. Because of Gabriella Hodges past medical history and time since last visit, he/she will require a follow-up visit in order to better assess preoperative cardiovascular risk.  Pre-op covering staff: - Please schedule Office appointment and call patient to inform them. - Please contact requesting surgeon's office via preferred method (i.e, phone, fax) to inform them of need for appointment prior to surgery.  If applicable, this message will also be routed to pharmacy pool and/or primary cardiologist for input on holding anticoagulant/antiplatelet agent as requested below so that this information is available at time of patient's appointment.   Gabriella CHRISTELLA Beauvais, NP  10/17/2023, 11:55 AM

## 2023-10-17 NOTE — Telephone Encounter (Signed)
 Pt has been scheduled to see Almarie Crate, NP, 10/29/23 2:45.  Will route back to the surgeon's office to make them aware.

## 2023-10-29 ENCOUNTER — Encounter: Payer: Self-pay | Admitting: Nurse Practitioner

## 2023-10-29 ENCOUNTER — Ambulatory Visit: Attending: Nurse Practitioner | Admitting: Nurse Practitioner

## 2023-10-29 VITALS — BP 154/78 | HR 62 | Ht 63.0 in | Wt 147.0 lb

## 2023-10-29 DIAGNOSIS — R0609 Other forms of dyspnea: Secondary | ICD-10-CM | POA: Diagnosis present

## 2023-10-29 DIAGNOSIS — R0989 Other specified symptoms and signs involving the circulatory and respiratory systems: Secondary | ICD-10-CM | POA: Diagnosis not present

## 2023-10-29 DIAGNOSIS — Z13220 Encounter for screening for lipoid disorders: Secondary | ICD-10-CM | POA: Diagnosis present

## 2023-10-29 DIAGNOSIS — I10 Essential (primary) hypertension: Secondary | ICD-10-CM | POA: Diagnosis not present

## 2023-10-29 DIAGNOSIS — R0789 Other chest pain: Secondary | ICD-10-CM | POA: Diagnosis not present

## 2023-10-29 DIAGNOSIS — J449 Chronic obstructive pulmonary disease, unspecified: Secondary | ICD-10-CM | POA: Insufficient documentation

## 2023-10-29 DIAGNOSIS — Z01810 Encounter for preprocedural cardiovascular examination: Secondary | ICD-10-CM | POA: Insufficient documentation

## 2023-10-29 DIAGNOSIS — Z720 Tobacco use: Secondary | ICD-10-CM | POA: Insufficient documentation

## 2023-10-29 DIAGNOSIS — Z136 Encounter for screening for cardiovascular disorders: Secondary | ICD-10-CM

## 2023-10-29 DIAGNOSIS — R002 Palpitations: Secondary | ICD-10-CM

## 2023-10-29 NOTE — Progress Notes (Addendum)
 Cardiology Office Note   Date:  10/29/2023 ID:  TYCHELLE PURKEY, DOB Dec 01, 1960, MRN [id number] PCP: Marvine Rush, MD  [facility] Providers Cardiologist:  Diannah SHAUNNA Maywood, MD     History of Present Illness Gabriella Hodges is a 63 y.o. female with a PMH chest pain, frequent PVCs, palpitations, anxiety, GERD, COPD, who presents today for preop clearance appointment.  Last seen by Dr. Mallipeddi on September 20, 2022.  She was overall doing well at the time.  Palpitations were felt to be due to her drinking 2 bottles of coffee and excessive caffeine intake.  She is pending future dental extraction, amount of teeth to be pulled is 11.  Surgery will be performed by Dr. Glendia EMERSON Primrose. Date of surgery is TBD.  She is here for scheduled follow-up.  She admits to abdominal pain, back pain, says she has cysts on her liver. Says when she eats, her chest hurts and stomach becomes upset. She is wondering if her symptoms are related to GERD. Denies any palpitations, syncope, presyncope, dizziness, orthopnea, PND, swelling or significant weight changes, acute bleeding, or claudication. Does admit to stable DOE related to her COPD.  ROS: Negative. See HPI.   SH: Smokes 1/2 PPD, not interested in quitting smoking.   Studies Reviewed EKG Interpretation Date/Time:  Tuesday October 29 2023 14:52:07 EDT Ventricular Rate:  62 PR Interval:  192 QRS Duration:  88 QT Interval:  424 QTC Calculation: 430 R Axis:   59  Text Interpretation: Normal sinus rhythm Normal ECG When compared with ECG of 07-Sep-2023 07:31, PREVIOUS ECG IS PRESENT Confirmed by Miriam Norris [phone]) on 10/29/2023 2:58:45 PM        Physical Exam VS:  BP (!) 154/78 (BP Location: Right Arm, Cuff Size: Normal)   Pulse 62   Ht 5' 3 (1.6 m)   Wt 147 lb (66.7 kg)   SpO2 97%   BMI 26.04 kg/m   Repeat blood pressure on exam: 138/62      Wt Readings from Last 3 Encounters:  10/29/23 147 lb (66.7 kg)  09/23/23 153  lb (69.4 kg)  06/24/23 162 lb 3.2 oz (73.6 kg)    GEN: Well-developed, well-nourished 63 y.o. female in no acute distress NECK: No JVD; bilateral carotid bruits CARDIAC: S1/S2, RRR, no murmurs, rubs, gallops RESPIRATORY:  Scattered expiratory wheezing to auscultation without rales,  or rhonchi; smoker's cough on exam ABDOMEN: Soft, non-tender, non-distended EXTREMITIES:  No edema; No deformity   ASSESSMENT AND PLAN  Pre-operative cardiovascular risk assessment Ms. Boivin's perioperative risk of a major cardiac event is 0.4% according to the Revised Cardiac Risk Index (RCRI).  Therefore, she is at low risk for perioperative complications.   Her functional capacity is excellent at 5.93 METs according to the Duke Activity Status Index (DASI). Recommendations: The patient requires an echocardiogram before a disposition can be made regarding surgical risk.                Antiplatelet and/or Anticoagulation Recommendations: She is not on any anticoagulation or antiplatelet medication that needs to be held prior to procedure.  Once echocardiogram report comes back benign/WNL, will update chart and send note to requesting party.  Addendum 11/18/2023:  Echo is benign and she is okay to proceed to surgery.  2. Atypical chest pain Difficult for patient to describe and her description sounds atypical.  EKG is reassuring today.  Did discuss ischemic evaluation as she does have some CAD risk factors, patient declines at this  time.  Recommend she follow-up with her PCP for what sounds like GERD/GI symptoms. She verbalized understanding.  Care and ED precautions discussed. Will arrange Echo for further evaluation.   3. HTN Blood pressure is not at goal today. Discussed to monitor BP at home at least 2 hours after medications and sitting for 5-10 minutes.  I have given her blood pressure log and salty 6.  Will bring her back in 2 to 3 weeks for repeat BP check.  She is not on any current BP medications  and if no improvement by next office visit, plan to begin ACE inhibitor/ARB. Heart healthy diet and regular cardiovascular exercise encouraged.   4.  Carotid bruits Bilateral carotid bruits noted on exam, will arrange carotid duplex.   5. Screening for HLD Due for labs.  Will obtain FLP/LFT in 1 to 2 weeks.  6. Tobacco abuse, DOE, COPD Smoking cessation encouraged and discussed.  Does have diagnosis of COPD.  Breathing seems to be stable per her report.  Will update echo as mentioned above.    Dispo: Follow-up with MD/APP in 6 to 8 weeks or sooner any changes.  Signed, Almarie Crate, NP

## 2023-10-29 NOTE — Patient Instructions (Addendum)
 Medication Instructions:  Your physician recommends that you continue on your current medications as directed. Please refer to the Current Medication list given to you today.   Labwork: In 1-2 weeks at [facility]   Testing/Procedures: Your physician has requested that you have an echocardiogram. Echocardiography is a painless test that uses sound waves to create images of your heart. It provides your doctor with information about the size and shape of your heart and how well your heart's chambers and valves are working. This procedure takes approximately one hour. There are no restrictions for this procedure. Please do NOT wear cologne, perfume, aftershave, or lotions (deodorant is allowed). Please arrive 15 minutes prior to your appointment time.  Please note: We ask at that you not bring children with you during ultrasound (echo/ vascular) testing. Due to room size and safety concerns, children are not allowed in the ultrasound rooms during exams. Our front office staff cannot provide observation of children in our lobby area while testing is being conducted. An adult accompanying a patient to their appointment will only be allowed in the ultrasound room at the discretion of the ultrasound technician under special circumstances. We apologize for any inconvenience. Your physician has requested that you have a carotid duplex. This test is an ultrasound of the carotid arteries in your neck. It looks at blood flow through these arteries that supply the brain with blood. Allow one hour for this exam. There are no restrictions or special instructions.   Follow-Up: Your physician recommends that you schedule a follow-up appointment in:  6-8 wk   Any Other Special Instructions Will Be Listed Below (If Applicable).  2-3 wks BP nurse visit    If you need a refill on your cardiac medications before your next appointment, please call your pharmacy.

## 2023-11-14 ENCOUNTER — Ambulatory Visit (INDEPENDENT_AMBULATORY_CARE_PROVIDER_SITE_OTHER)

## 2023-11-14 ENCOUNTER — Ambulatory Visit: Attending: Nurse Practitioner

## 2023-11-14 VITALS — BP 150/80

## 2023-11-14 DIAGNOSIS — I10 Essential (primary) hypertension: Secondary | ICD-10-CM | POA: Insufficient documentation

## 2023-11-14 DIAGNOSIS — Z01810 Encounter for preprocedural cardiovascular examination: Secondary | ICD-10-CM | POA: Insufficient documentation

## 2023-11-14 DIAGNOSIS — R0789 Other chest pain: Secondary | ICD-10-CM | POA: Insufficient documentation

## 2023-11-14 DIAGNOSIS — R0989 Other specified symptoms and signs involving the circulatory and respiratory systems: Secondary | ICD-10-CM

## 2023-11-14 DIAGNOSIS — R0609 Other forms of dyspnea: Secondary | ICD-10-CM | POA: Diagnosis present

## 2023-11-14 LAB — ECHOCARDIOGRAM COMPLETE
AR max vel: 2.87 cm2
AV Peak grad: 6.6 mmHg
Ao pk vel: 1.28 m/s
Area-P 1/2: 3.65 cm2
Calc EF: 55.9 %
S' Lateral: 3.6 cm
Single Plane A2C EF: 57.4 %
Single Plane A4C EF: 56.9 %

## 2023-11-14 MED ORDER — LOSARTAN POTASSIUM 25 MG PO TABS: 25.0000 mg | ORAL_TABLET | Freq: Every day | ORAL | 3 refills | Status: AC

## 2023-11-14 NOTE — Addendum Note (Signed)
 Addended by: KENETH KNEE C on: 11/14/2023 01:36 PM   Modules accepted: Orders

## 2023-11-14 NOTE — Progress Notes (Signed)
 Patient presents today for NV- BP check per E. Miriam, NP  Pt bp in office: 150/80   Pt sat quietly for 5-10 minutes, feet flat on the floor, and back against back of chair prior to bp being taken.

## 2023-11-14 NOTE — Progress Notes (Signed)
 Mychart message and phone call (lmom) placed to pt.

## 2023-11-18 ENCOUNTER — Ambulatory Visit: Payer: Self-pay | Admitting: Nurse Practitioner

## 2023-11-19 ENCOUNTER — Inpatient Hospital Stay: Attending: Physician Assistant

## 2023-11-19 DIAGNOSIS — R634 Abnormal weight loss: Secondary | ICD-10-CM | POA: Insufficient documentation

## 2023-11-19 DIAGNOSIS — Z9884 Bariatric surgery status: Secondary | ICD-10-CM | POA: Diagnosis not present

## 2023-11-19 DIAGNOSIS — R1013 Epigastric pain: Secondary | ICD-10-CM | POA: Diagnosis not present

## 2023-11-19 DIAGNOSIS — K838 Other specified diseases of biliary tract: Secondary | ICD-10-CM | POA: Insufficient documentation

## 2023-11-19 DIAGNOSIS — J449 Chronic obstructive pulmonary disease, unspecified: Secondary | ICD-10-CM | POA: Insufficient documentation

## 2023-11-19 DIAGNOSIS — R1310 Dysphagia, unspecified: Secondary | ICD-10-CM | POA: Diagnosis not present

## 2023-11-19 DIAGNOSIS — Z79899 Other long term (current) drug therapy: Secondary | ICD-10-CM | POA: Insufficient documentation

## 2023-11-19 DIAGNOSIS — D509 Iron deficiency anemia, unspecified: Secondary | ICD-10-CM | POA: Diagnosis present

## 2023-11-19 DIAGNOSIS — R110 Nausea: Secondary | ICD-10-CM | POA: Diagnosis not present

## 2023-11-19 DIAGNOSIS — D508 Other iron deficiency anemias: Secondary | ICD-10-CM

## 2023-11-19 DIAGNOSIS — R0602 Shortness of breath: Secondary | ICD-10-CM | POA: Insufficient documentation

## 2023-11-19 DIAGNOSIS — R42 Dizziness and giddiness: Secondary | ICD-10-CM | POA: Insufficient documentation

## 2023-11-19 DIAGNOSIS — R059 Cough, unspecified: Secondary | ICD-10-CM | POA: Diagnosis not present

## 2023-11-19 DIAGNOSIS — R519 Headache, unspecified: Secondary | ICD-10-CM | POA: Insufficient documentation

## 2023-11-19 DIAGNOSIS — R079 Chest pain, unspecified: Secondary | ICD-10-CM | POA: Diagnosis not present

## 2023-11-19 DIAGNOSIS — E538 Deficiency of other specified B group vitamins: Secondary | ICD-10-CM | POA: Insufficient documentation

## 2023-11-19 DIAGNOSIS — G4700 Insomnia, unspecified: Secondary | ICD-10-CM | POA: Insufficient documentation

## 2023-11-19 DIAGNOSIS — R002 Palpitations: Secondary | ICD-10-CM | POA: Insufficient documentation

## 2023-11-19 DIAGNOSIS — R5382 Chronic fatigue, unspecified: Secondary | ICD-10-CM | POA: Diagnosis not present

## 2023-11-19 LAB — CBC WITH DIFFERENTIAL/PLATELET
Abs Immature Granulocytes: 0.01 K/uL (ref 0.00–0.07)
Basophils Absolute: 0.1 K/uL (ref 0.0–0.1)
Basophils Relative: 1 %
Eosinophils Absolute: 0.2 K/uL (ref 0.0–0.5)
Eosinophils Relative: 3 %
HCT: 40.1 % (ref 36.0–46.0)
Hemoglobin: 13.4 g/dL (ref 12.0–15.0)
Immature Granulocytes: 0 %
Lymphocytes Relative: 21 %
Lymphs Abs: 1.4 K/uL (ref 0.7–4.0)
MCH: 32.8 pg (ref 26.0–34.0)
MCHC: 33.4 g/dL (ref 30.0–36.0)
MCV: 98 fL (ref 80.0–100.0)
Monocytes Absolute: 0.6 K/uL (ref 0.1–1.0)
Monocytes Relative: 8 %
Neutro Abs: 4.6 K/uL (ref 1.7–7.7)
Neutrophils Relative %: 67 %
Platelets: 247 K/uL (ref 150–400)
RBC: 4.09 MIL/uL (ref 3.87–5.11)
RDW: 14.8 % (ref 11.5–15.5)
WBC: 6.8 K/uL (ref 4.0–10.5)
nRBC: 0 % (ref 0.0–0.2)

## 2023-11-19 LAB — IRON AND TIBC
Iron: 62 ug/dL (ref 28–170)
Saturation Ratios: 27 % (ref 10.4–31.8)
TIBC: 225 ug/dL — ABNORMAL LOW (ref 250–450)
UIBC: 164 ug/dL

## 2023-11-19 LAB — VITAMIN B12: Vitamin B-12: 321 pg/mL (ref 180–914)

## 2023-11-19 LAB — FERRITIN: Ferritin: 98 ng/mL (ref 11–307)

## 2023-11-21 ENCOUNTER — Encounter: Payer: Self-pay | Admitting: Gastroenterology

## 2023-11-21 ENCOUNTER — Encounter: Payer: Self-pay | Admitting: *Deleted

## 2023-11-21 ENCOUNTER — Ambulatory Visit (INDEPENDENT_AMBULATORY_CARE_PROVIDER_SITE_OTHER): Admitting: Gastroenterology

## 2023-11-21 VITALS — BP 137/88 | HR 60 | Temp 98.4°F | Ht 63.0 in | Wt 147.8 lb

## 2023-11-21 DIAGNOSIS — K219 Gastro-esophageal reflux disease without esophagitis: Secondary | ICD-10-CM | POA: Diagnosis not present

## 2023-11-21 DIAGNOSIS — R1310 Dysphagia, unspecified: Secondary | ICD-10-CM | POA: Diagnosis not present

## 2023-11-21 DIAGNOSIS — R09A2 Foreign body sensation, throat: Secondary | ICD-10-CM

## 2023-11-21 DIAGNOSIS — R1013 Epigastric pain: Secondary | ICD-10-CM | POA: Diagnosis not present

## 2023-11-21 DIAGNOSIS — D509 Iron deficiency anemia, unspecified: Secondary | ICD-10-CM

## 2023-11-21 MED ORDER — PANTOPRAZOLE SODIUM 40 MG PO TBEC: 40.0000 mg | DELAYED_RELEASE_TABLET | Freq: Two times a day (BID) | ORAL | 3 refills | Status: AC

## 2023-11-21 NOTE — Patient Instructions (Signed)
 We are arranging an upper endoscopy with dilation by Dr. Cindie in the near future.  At some point we will need to do a colonoscopy, but we need to address upper GI symptoms first.  Stop omeprazole .  I have sent in pantoprazole  40 mg for you to take twice a day 30 minutes before breakfast and dinner.  Make sure that you avoid ibuprofen, Advil, Aleve, and anything similar to this and only take Tylenol  as needed.  We will see you in 3 months regardless.  It was a pleasure to see you today. I want to create trusting relationships with patients and provide genuine, compassionate, and quality care. I truly value your feedback, so please be on the lookout for a survey regarding your visit with me today. I appreciate your time in completing this!         Therisa MICAEL Stager, PhD, ANP-BC [facility]

## 2023-11-21 NOTE — H&P (View-Only) (Signed)
 Gastroenterology Office Note    Referring Provider: Marvine Rush, MD Primary Care Physician:  Edman Meade PEDLAR, FNP  Primary GI: Dr. Cindie    Chief Complaint   Chief Complaint  Patient presents with   Follow-up    Follow up from ED to discuss procedure(s)     History of Present Illness   Gabriella Hodges is a 63 y.o. female presenting today at the request of [facility] due to abdominal pain and follow-up from ED visit.  Past medical history significant for iron  deficiency anemia, gastric bypass surgery in the past, anxiety, epilepsy, hypothyroidism, last seen in October 2023.   In the ED, ultrasound of abdomen August 2025 with intrahepatic bile duct dilatation and common bile duct measuring 9.4 mm.  Gallbladder absent.  Normal-appearing liver.  CT with highly redundant large bowel but no obstruction.  Apparently, CBD enlargement was similar to the 2011 CT scan.  Several tiny low-density areas in the liver that were felt to be benign cyst.  MRCP was completed with mild intrahepatic bile duct dilatation and common bile duct dilatation.  The common bile duct was 1.2 cm with no signs of choledocholithiasis.  Normal-appearing liver without fatty infiltration.  Pancreas was atrophic.  Moderate stool burden noted within the colon.  Labs during presentation to the emergency room showed bilirubin normal at 0.3, AST 13, ALT 12, alk phos 104. CBC normal. Lipase normal.    States she presented to ED with worsening of her chronic upper abdominal pain. Anything she eats will cause postprandial abdominal pain. Sometimes food will not go down esophagus. Sometimes difficulties with liquids. Hard time with pills. Chronic. Notes hx of dilations in the past. No melena or hematochezia.   Unintentional weight loss. Feb 2025 of this year was 165, 153 in Sept 2025, now 147. Baseline looks to be 165-175. Has cut back on oral intake as difficulty swallowing at times. Feels like she has golf ball in  throat.   Takes Ibuprofen for knee pain usually every 2 days. Takes 800 mg. Omeprazole  40 mg once daily. Been on this for years.   Denies any constipation, diarrhea, changes in bowel habits. BM about 3-4 times per day.    Last colonoscopy: about 15 years ago at [facility]. No known polyps.   Last EGD: about 15 years ago also.    No FH colon cancer or colon polyps   Past Medical History:  Diagnosis Date   Anxiety    Epilepsy (HCC)    GERD (gastroesophageal reflux disease)    Hypothyroidism    Thyroid disease     Past Surgical History:  Procedure Laterality Date   GASTRIC BYPASS     THYROID SURGERY     tumors   tumors     breast-benign    Current Outpatient Medications  Medication Sig Dispense Refill   Budeson-Glycopyrrol-Formoterol (BREZTRI  AEROSPHERE) 160-9-4.8 MCG/ACT AERO Take 2 puffs first thing in am and then another 2 puffs about 12 hours later. 10.7 g 11   diclofenac  (CATAFLAM ) 50 MG tablet Take 1 tablet (50 mg total) by mouth 2 (two) times daily. (Patient not taking: Reported on 11/21/2023) 60 tablet 0   docusate sodium  (COLACE) 100 MG capsule Take 100 mg by mouth daily.     famotidine  (PEPCID ) 20 MG tablet One after supper 30 tablet 11   fluticasone  (FLONASE ) 50 MCG/ACT nasal spray Place 2 sprays into both nostrils 2 (two) times daily. 16 g 6   ibuprofen (ADVIL) 200  MG tablet Take 200-800 mg by mouth every 6 (six) hours as needed for moderate pain (pain score 4-6).     levocetirizine (XYZAL  ALLERGY 24HR) 5 MG tablet Take 1 tablet (5 mg total) by mouth every evening. 30 tablet 3   levothyroxine  (SYNTHROID ) 200 MCG tablet Take 200 mcg by mouth daily before breakfast.     losartan  (COZAAR ) 25 MG tablet Take 1 tablet (25 mg total) by mouth daily. 90 tablet 3   omeprazole  (PRILOSEC) 40 MG capsule Take 1 capsule (40 mg total) by mouth daily. 30 capsule 0   PHENobarbital  (LUMINAL) 97.2 MG tablet Take 97.2 mg by mouth at bedtime.       VENTOLIN  HFA 108 (90 Base) MCG/ACT  inhaler Inhale 2 puffs into the lungs every 4 (four) hours as needed.     traMADol  (ULTRAM ) 50 MG tablet Take 50 mg by mouth every 4 (four) hours as needed. (Patient not taking: Reported on 11/21/2023)     No current facility-administered medications for this visit.    Allergies as of 11/21/2023 - Review Complete 11/21/2023  Allergen Reaction Noted   Shellfish protein-containing drug products Anaphylaxis 05/04/2010   Nsaids Hives 05/04/2010   Penicillins Hives 05/04/2010   Sulfa antibiotics Hives 05/04/2010    Family History  Problem Relation Age of Onset   Thyroid cancer Mother    Heart disease Father    Breast cancer Sister    Anemia Sister    Heart disease Other    Lung disease Other    Cancer Other    Diabetes Other    Colon cancer Neg Hx     Social History   Socioeconomic History   Marital status: Married    Spouse name: Not on file   Number of children: Not on file   Years of education: 11th grade   Highest education level: Not on file  Occupational History   Occupation: none  Tobacco Use   Smoking status: Every Day    Current packs/day: 0.50    Average packs/day: 0.5 packs/day for 30.0 years (15.0 ttl pk-yrs)    Types: Cigarettes   Smokeless tobacco: Never  Vaping Use   Vaping status: Never Used  Substance and Sexual Activity   Alcohol use: No   Drug use: No   Sexual activity: Yes  Other Topics Concern   Not on file  Social History Narrative   Not on file   Social Drivers of Health   Financial Resource Strain: Not on file  Food Insecurity: No Food Insecurity (02/18/2023)   Hunger Vital Sign    Worried About Running Out of Food in the Last Year: Never true    Ran Out of Food in the Last Year: Never true  Transportation Needs: No Transportation Needs (02/18/2023)   PRAPARE - Administrator, Civil Service (Medical): No    Lack of Transportation (Non-Medical): No  Physical Activity: Not on file  Stress: Not on file  Social Connections:  Not on file  Intimate Partner Violence: Not At Risk (02/18/2023)   Humiliation, Afraid, Rape, and Kick questionnaire    Fear of Current or Ex-Partner: No    Emotionally Abused: No    Physically Abused: No    Sexually Abused: No     Review of Systems   See HPI   Physical Exam   BP 137/88   Pulse 60   Temp 98.4 F (36.9 C)   Ht 5' 3 (1.6 m)   Wt 147 lb 12.8  oz (67 kg)   BMI 26.18 kg/m  General:   Alert and oriented. Pleasant and cooperative. Well-nourished and well-developed.  Head:  Normocephalic and atraumatic. Eyes:  Without icterus Ears:  Normal auditory acuity. Lungs:  Clear to auscultation bilaterally.  Heart:  S1, S2 present without murmurs appreciated.  Abdomen:  +BS, soft, non-tender and non-distended. No HSM noted. No guarding or rebound. No masses appreciated.  Rectal:  Deferred  Msk:  Symmetrical without gross deformities. Normal posture. Extremities:  Without edema. Neurologic:  Alert and  oriented x4;  grossly normal neurologically. Skin:  Intact without significant lesions or rashes. Psych:  Alert and cooperative. Normal mood and affect.   Assessment   Gabriella Hodges is a 63 y.o. female presenting today at the request of [facility] due to abdominal pain and follow-up from ED visit.  Past medical history significant for iron  deficiency anemia, gastric bypass surgery in the past, anxiety, epilepsy, hypothyroidism, last seen in October 2023.  Epigastric abdominal pain: Will need diagnostic EGD in the near future.  I suspect she may have ulcerative disease in light of taking NSAIDs and prior history of gastric bypass.  Unable to rule out internal hernia as she has had weight loss as well and postprandial abdominal pain.  I doubt dealing with chronic mesenteric ischemia but would certainly order CTA if endoscopy is normal and continues to have abdominal pain and weight loss  Dysphagia: Suspect ring, stricture, in setting of GERD.  She has had dilations  before in the past but none recently.  Will also change her omeprazole  to pantoprazole  and increase this to twice a day.  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  We will need to circle back on a colonoscopy when she is able to take her prep appropriately.  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  Chronic intrahepatic bile duct dilatation and common bile duct dilatation:  Gallbladder absent.  Normal-appearing liver.   CBD enlargement was similar to the 2011 CT scan.  Several tiny low-density areas in the liver that were felt to be benign cyst.  MRCP was completed with mild intrahepatic bile duct dilatation and common bile duct dilatation, no choledocholithiasis, normal liver, atrophic pancreas, labs normal. If bump in labs can pursue further investigation.    PLAN   EGD/dilation expedited. Stop omeprazole . Start pantoprazole  and increase to BID Avoid all NSAIDs Needs CTA if further abdominal pain, weight loss.  Return in follow-up thereafter and will need to discuss colonoscopy once UGI symptoms addressed  Therisa MICAEL Stager, PhD, ANP-BC [facility]

## 2023-11-21 NOTE — Progress Notes (Signed)
 Gastroenterology Office Note    Referring Provider: Marvine Rush, MD Primary Care Physician:  Edman Meade PEDLAR, FNP  Primary GI: Dr. Cindie    Chief Complaint   Chief Complaint  Patient presents with   Follow-up    Follow up from ED to discuss procedure(s)     History of Present Illness   Gabriella Hodges is a 63 y.o. female presenting today at the request of [facility] due to abdominal pain and follow-up from ED visit.  Past medical history significant for iron  deficiency anemia, gastric bypass surgery in the past, anxiety, epilepsy, hypothyroidism, last seen in October 2023.   In the ED, ultrasound of abdomen August 2025 with intrahepatic bile duct dilatation and common bile duct measuring 9.4 mm.  Gallbladder absent.  Normal-appearing liver.  CT with highly redundant large bowel but no obstruction.  Apparently, CBD enlargement was similar to the 2011 CT scan.  Several tiny low-density areas in the liver that were felt to be benign cyst.  MRCP was completed with mild intrahepatic bile duct dilatation and common bile duct dilatation.  The common bile duct was 1.2 cm with no signs of choledocholithiasis.  Normal-appearing liver without fatty infiltration.  Pancreas was atrophic.  Moderate stool burden noted within the colon.  Labs during presentation to the emergency room showed bilirubin normal at 0.3, AST 13, ALT 12, alk phos 104. CBC normal. Lipase normal.    States she presented to ED with worsening of her chronic upper abdominal pain. Anything she eats will cause postprandial abdominal pain. Sometimes food will not go down esophagus. Sometimes difficulties with liquids. Hard time with pills. Chronic. Notes hx of dilations in the past. No melena or hematochezia.   Unintentional weight loss. Feb 2025 of this year was 165, 153 in Sept 2025, now 147. Baseline looks to be 165-175. Has cut back on oral intake as difficulty swallowing at times. Feels like she has golf ball in  throat.   Takes Ibuprofen for knee pain usually every 2 days. Takes 800 mg. Omeprazole  40 mg once daily. Been on this for years.   Denies any constipation, diarrhea, changes in bowel habits. BM about 3-4 times per day.    Last colonoscopy: about 15 years ago at [facility]. No known polyps.   Last EGD: about 15 years ago also.    No FH colon cancer or colon polyps   Past Medical History:  Diagnosis Date   Anxiety    Epilepsy (HCC)    GERD (gastroesophageal reflux disease)    Hypothyroidism    Thyroid disease     Past Surgical History:  Procedure Laterality Date   GASTRIC BYPASS     THYROID SURGERY     tumors   tumors     breast-benign    Current Outpatient Medications  Medication Sig Dispense Refill   Budeson-Glycopyrrol-Formoterol (BREZTRI  AEROSPHERE) 160-9-4.8 MCG/ACT AERO Take 2 puffs first thing in am and then another 2 puffs about 12 hours later. 10.7 g 11   diclofenac  (CATAFLAM ) 50 MG tablet Take 1 tablet (50 mg total) by mouth 2 (two) times daily. (Patient not taking: Reported on 11/21/2023) 60 tablet 0   docusate sodium  (COLACE) 100 MG capsule Take 100 mg by mouth daily.     famotidine  (PEPCID ) 20 MG tablet One after supper 30 tablet 11   fluticasone  (FLONASE ) 50 MCG/ACT nasal spray Place 2 sprays into both nostrils 2 (two) times daily. 16 g 6   ibuprofen (ADVIL) 200  MG tablet Take 200-800 mg by mouth every 6 (six) hours as needed for moderate pain (pain score 4-6).     levocetirizine (XYZAL  ALLERGY 24HR) 5 MG tablet Take 1 tablet (5 mg total) by mouth every evening. 30 tablet 3   levothyroxine  (SYNTHROID ) 200 MCG tablet Take 200 mcg by mouth daily before breakfast.     losartan  (COZAAR ) 25 MG tablet Take 1 tablet (25 mg total) by mouth daily. 90 tablet 3   omeprazole  (PRILOSEC) 40 MG capsule Take 1 capsule (40 mg total) by mouth daily. 30 capsule 0   PHENobarbital  (LUMINAL) 97.2 MG tablet Take 97.2 mg by mouth at bedtime.       VENTOLIN  HFA 108 (90 Base) MCG/ACT  inhaler Inhale 2 puffs into the lungs every 4 (four) hours as needed.     traMADol  (ULTRAM ) 50 MG tablet Take 50 mg by mouth every 4 (four) hours as needed. (Patient not taking: Reported on 11/21/2023)     No current facility-administered medications for this visit.    Allergies as of 11/21/2023 - Review Complete 11/21/2023  Allergen Reaction Noted   Shellfish protein-containing drug products Anaphylaxis 05/04/2010   Nsaids Hives 05/04/2010   Penicillins Hives 05/04/2010   Sulfa antibiotics Hives 05/04/2010    Family History  Problem Relation Age of Onset   Thyroid cancer Mother    Heart disease Father    Breast cancer Sister    Anemia Sister    Heart disease Other    Lung disease Other    Cancer Other    Diabetes Other    Colon cancer Neg Hx     Social History   Socioeconomic History   Marital status: Married    Spouse name: Not on file   Number of children: Not on file   Years of education: 11th grade   Highest education level: Not on file  Occupational History   Occupation: none  Tobacco Use   Smoking status: Every Day    Current packs/day: 0.50    Average packs/day: 0.5 packs/day for 30.0 years (15.0 ttl pk-yrs)    Types: Cigarettes   Smokeless tobacco: Never  Vaping Use   Vaping status: Never Used  Substance and Sexual Activity   Alcohol use: No   Drug use: No   Sexual activity: Yes  Other Topics Concern   Not on file  Social History Narrative   Not on file   Social Drivers of Health   Financial Resource Strain: Not on file  Food Insecurity: No Food Insecurity (02/18/2023)   Hunger Vital Sign    Worried About Running Out of Food in the Last Year: Never true    Ran Out of Food in the Last Year: Never true  Transportation Needs: No Transportation Needs (02/18/2023)   PRAPARE - Administrator, Civil Service (Medical): No    Lack of Transportation (Non-Medical): No  Physical Activity: Not on file  Stress: Not on file  Social Connections:  Not on file  Intimate Partner Violence: Not At Risk (02/18/2023)   Humiliation, Afraid, Rape, and Kick questionnaire    Fear of Current or Ex-Partner: No    Emotionally Abused: No    Physically Abused: No    Sexually Abused: No     Review of Systems   See HPI   Physical Exam   BP 137/88   Pulse 60   Temp 98.4 F (36.9 C)   Ht 5' 3 (1.6 m)   Wt 147 lb 12.8  oz (67 kg)   BMI 26.18 kg/m  General:   Alert and oriented. Pleasant and cooperative. Well-nourished and well-developed.  Head:  Normocephalic and atraumatic. Eyes:  Without icterus Ears:  Normal auditory acuity. Lungs:  Clear to auscultation bilaterally.  Heart:  S1, S2 present without murmurs appreciated.  Abdomen:  +BS, soft, non-tender and non-distended. No HSM noted. No guarding or rebound. No masses appreciated.  Rectal:  Deferred  Msk:  Symmetrical without gross deformities. Normal posture. Extremities:  Without edema. Neurologic:  Alert and  oriented x4;  grossly normal neurologically. Skin:  Intact without significant lesions or rashes. Psych:  Alert and cooperative. Normal mood and affect.   Assessment   Gabriella Hodges is a 63 y.o. female presenting today at the request of [facility] due to abdominal pain and follow-up from ED visit.  Past medical history significant for iron  deficiency anemia, gastric bypass surgery in the past, anxiety, epilepsy, hypothyroidism, last seen in October 2023.  Epigastric abdominal pain: Will need diagnostic EGD in the near future.  I suspect she may have ulcerative disease in light of taking NSAIDs and prior history of gastric bypass.  Unable to rule out internal hernia as she has had weight loss as well and postprandial abdominal pain.  I doubt dealing with chronic mesenteric ischemia but would certainly order CTA if endoscopy is normal and continues to have abdominal pain and weight loss  Dysphagia: Suspect ring, stricture, in setting of GERD.  She has had dilations  before in the past but none recently.  Will also change her omeprazole  to pantoprazole  and increase this to twice a day.  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  We will need to circle back on a colonoscopy when she is able to take her prep appropriately.  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  Chronic intrahepatic bile duct dilatation and common bile duct dilatation:  Gallbladder absent.  Normal-appearing liver.   CBD enlargement was similar to the 2011 CT scan.  Several tiny low-density areas in the liver that were felt to be benign cyst.  MRCP was completed with mild intrahepatic bile duct dilatation and common bile duct dilatation, no choledocholithiasis, normal liver, atrophic pancreas, labs normal. If bump in labs can pursue further investigation.    PLAN   EGD/dilation expedited. Stop omeprazole . Start pantoprazole  and increase to BID Avoid all NSAIDs Needs CTA if further abdominal pain, weight loss.  Return in follow-up thereafter and will need to discuss colonoscopy once UGI symptoms addressed  Gabriella MICAEL Stager, PhD, ANP-BC [facility]

## 2023-11-25 LAB — METHYLMALONIC ACID, SERUM: Methylmalonic Acid, Quantitative: 252 nmol/L (ref 0–378)

## 2023-11-25 NOTE — Progress Notes (Unsigned)
 VIRTUAL VISIT via TELEPHONE NOTE [facility]   I connected with Gabriella Hodges  on 11/26/23 at 12:33 PM by telephone and verified that I am speaking with the correct person using two identifiers.  Location: Patient: Home Provider: Home office   I discussed the limitations, risks, security and privacy concerns of performing an evaluation and management service by telephone and the availability of in person appointments. I also discussed with the patient that there may be a patient responsible charge related to this service. The patient expressed understanding and agreed to proceed.  REASON FOR VISIT:  Follow-up for iron  deficiency anemia   CURRENT THERAPY: Intermittent IV iron   INTERVAL HISTORY:   Ms. Gabriella Hodges 63 y.o. female returns for routine follow-up of iron  deficiency anemia.   She was last evaluated via telemedicine visit on 05/28/2023 by Pleasant Barefoot PA-C.   (Her last IV iron  was with Venofer  300 mg x 2 doses on 11/21/2021 and 12/26/2021, given with IV Zofran  to ameliorate her nausea.)  She continues to have chronic fatigue, which had not improved after IV iron  in the past.   She has not noticed any recent rectal bleeding or melena.   She reports occasional lightheadedness and chronic headaches.   No pica or syncope.   She has occasional chest pain, and is following with cardiology; she reports intermittent palpitations.   She continues to follow with Dr. Darlean for COPD and chronic shortness of breath/dyspnea on exertion.   She ran out of her vitamin B12 sublingual supplement a few months ago.    Patient reports ongoing pain in her upper abdomen that radiates across her chest and through to her back, especially after eating.  She had ED visit for this in August, with negative cardiac workup.  (She continues to follow with cardiologist, who is aware of symptoms).  She was also evaluated by GI, and has upcoming EGD due to concern for possibility of ulcerative  disease.  Per GI note from 11/21/2023, she has lost 18 pounds in the past 9 months (weight 165 in February 2025, now 147 pounds as of November 2025).  She has cut back on oral intake due to difficulty swallowing, and is scheduled for upcoming EGD workup with gastroenterology.   US  abdomen and CT abdomen from 09/07/2023 ED visit showed some dilation of common bile duct, but MRCP was negative for choledocholithiasis or mass.  She has 50% energy and 50% appetite.   ASSESSMENT & PLAN:  1.  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 Cindie in November 2023, but this was canceled due to transportation issues.  Patient is scheduled for EGD on 12/09/2023, due to difficulty swallowing and suspicion for ulcerative disease (taking NSAIDs with history of gastric bypass). - She was unable to tolerate oral iron  tablets due to nausea and indigestion - Most recent Venofer  in December 2023 (tolerates better with IV ZOFRAN ) - She has not noticed any rectal bleeding for the past several months; no recurrence of her burning abdominal pain.  No history of melena. - Chronic fatigue and dyspnea on exertion are at baseline.  No improvement in energy after last IV iron . - Most recent labs (11/19/2023): Hgb 13.4/MCV 98.0, ferritin 98, iron  saturation 7% - PLAN: No IV iron  at this time. - We will repeat labs (CBC/D, ferritin, iron /TIBC) in 1 year - Patient instructed to return sooner if she has any issues prior  to next appointment   2.  Vitamin B12 deficiency - She was taking vitamin B12 (sublingual) 1000 mcg once a day, but ran out a few months ago - Most recent B12 levels (11/19/2023): Vitamin B12 321, MMA normal - PLAN: Recommended that she restart sublingual vitamin B12 1000 mcg daily - We we will recheck B12/MMA at her follow-up visit in 1 year  3.  Weight loss - Per GI note from 11/21/2023, she has lost 18 pounds in  the past 9 months (weight 165 in February 2025, now 147 pounds as of November 2025).  She has cut back on oral intake due to difficulty swallowing, and is scheduled for upcoming EGD workup with gastroenterology.    - US  abdomen and CT abdomen from 09/07/2023 ED visit showed some dilation of common bile duct, but MRCP was negative for choledocholithiasis or mass. - PLAN: Continue workup with GI upcoming EGD   PLAN SUMMARY: >> Labs 1 year (CBC/D, ferritin, iron /TIBC, B12, MMA) >> OFFICE visit 1 year (1 week after labs)   **Last office visit 11/26/2022      REVIEW OF SYSTEMS:   Review of Systems  Constitutional:  Positive for malaise/fatigue and weight loss. Negative for chills, diaphoresis and fever.  Respiratory:  Positive for cough and shortness of breath.   Cardiovascular:  Positive for chest pain and palpitations.  Gastrointestinal:  Positive for abdominal pain (after eating, radiating across chest and to back), diarrhea and nausea. Negative for blood in stool, melena and vomiting.  Neurological:  Positive for dizziness and headaches.  Psychiatric/Behavioral:  The patient has insomnia.       PHYSICAL EXAM: (per limitations of virtual telephone visit)  The patient is alert and oriented x 3, exhibiting adequate mentation, good mood, and ability to speak in full sentences and execute sound judgement.  WRAP UP:   I discussed the assessment and treatment plan with the patient. The patient was provided an opportunity to ask questions and all were answered. The patient agreed with the plan and demonstrated an understanding of the instructions.   The patient was advised to call back or seek an in-person evaluation if the symptoms worsen or if the condition fails to improve as anticipated.  I provided 22 minutes of non-face-to-face time during this encounter, including > 10 minutes of medical discussion.  Pleasant CHRISTELLA Barefoot, PA-C 11/26/23 12:55 PM

## 2023-11-26 ENCOUNTER — Inpatient Hospital Stay (HOSPITAL_BASED_OUTPATIENT_CLINIC_OR_DEPARTMENT_OTHER): Admitting: Physician Assistant

## 2023-11-26 DIAGNOSIS — E538 Deficiency of other specified B group vitamins: Secondary | ICD-10-CM

## 2023-11-26 DIAGNOSIS — D508 Other iron deficiency anemias: Secondary | ICD-10-CM

## 2023-12-04 ENCOUNTER — Other Ambulatory Visit: Payer: Self-pay

## 2023-12-04 ENCOUNTER — Encounter (HOSPITAL_COMMUNITY)
Admission: RE | Admit: 2023-12-04 | Discharge: 2023-12-04 | Disposition: A | Discharge status: Home / Self Care | Source: Ambulatory Visit | Attending: Internal Medicine | Admitting: Internal Medicine

## 2023-12-04 ENCOUNTER — Encounter (HOSPITAL_COMMUNITY): Payer: Self-pay

## 2023-12-09 ENCOUNTER — Encounter (HOSPITAL_COMMUNITY)
Admission: RE | Disposition: A | Discharge status: Home / Self Care | Payer: Self-pay | Source: Home / Self Care | Attending: Internal Medicine

## 2023-12-09 ENCOUNTER — Ambulatory Visit (HOSPITAL_COMMUNITY)
Admission: RE | Admit: 2023-12-09 | Discharge: 2023-12-09 | Disposition: A | Discharge status: Home / Self Care | Attending: Internal Medicine | Admitting: Internal Medicine

## 2023-12-09 ENCOUNTER — Encounter (HOSPITAL_COMMUNITY): Payer: Self-pay | Admitting: Internal Medicine

## 2023-12-09 ENCOUNTER — Ambulatory Visit (HOSPITAL_COMMUNITY)

## 2023-12-09 DIAGNOSIS — R1310 Dysphagia, unspecified: Secondary | ICD-10-CM | POA: Diagnosis not present

## 2023-12-09 DIAGNOSIS — J449 Chronic obstructive pulmonary disease, unspecified: Secondary | ICD-10-CM | POA: Diagnosis not present

## 2023-12-09 DIAGNOSIS — B379 Candidiasis, unspecified: Secondary | ICD-10-CM

## 2023-12-09 HISTORY — PX: ESOPHAGOGASTRODUODENOSCOPY: SHX5428

## 2023-12-09 LAB — KOH PREP

## 2023-12-09 SURGERY — EGD (ESOPHAGOGASTRODUODENOSCOPY)
Anesthesia: General

## 2023-12-09 MED ORDER — LACTATED RINGERS IV SOLN: INTRAVENOUS | Status: DC | PRN

## 2023-12-09 MED ORDER — LACTATED RINGERS IV SOLN: INTRAVENOUS | Status: DC

## 2023-12-09 MED ORDER — PROPOFOL 10 MG/ML IV BOLUS
INTRAVENOUS | Status: DC | PRN
  Administered 2023-12-09: 200 ug/kg/min via INTRAVENOUS
  Administered 2023-12-09: 70 mg via INTRAVENOUS

## 2023-12-09 NOTE — Transfer of Care (Signed)
 Immediate Anesthesia Transfer of Care Note  Patient: Gabriella Hodges  Procedure(s) Performed: EGD (ESOPHAGOGASTRODUODENOSCOPY)  Patient Location: PACU  Anesthesia Type:General  Level of Consciousness: awake, alert , and oriented  Airway & Oxygen Therapy: Patient Spontanous Breathing  Post-op Assessment: Report given to RN and Post -op Vital signs reviewed and stable  Post vital signs: Reviewed and stable  Last Vitals:  Vitals Value Taken Time  BP    Temp    Pulse    Resp    SpO2      Last Pain:  Vitals:   12/09/23 1058  PainSc: 0-No pain         Complications: No notable events documented.

## 2023-12-09 NOTE — Anesthesia Preprocedure Evaluation (Addendum)
 Anesthesia Evaluation  Patient identified by MRN, date of birth, ID band Patient awake    Reviewed: Allergy & Precautions, H&P , NPO status , Patient's Chart, lab work & pertinent test results  Airway Mallampati: II  TM Distance: >3 FB Neck ROM: Full    Dental  (+) Dental Advisory Given, Poor Dentition   Pulmonary COPD, Current SmokerPatient did not abstain from smoking.   Pulmonary exam normal breath sounds clear to auscultation       Cardiovascular negative cardio ROS Normal cardiovascular exam Rhythm:Regular Rate:Normal     Neuro/Psych Seizures -,   Anxiety     Last partial seizure 3 weeks ago  negative psych ROS   GI/Hepatic Neg liver ROS,GERD  ,,  Endo/Other  Hypothyroidism    Renal/GU negative Renal ROS  negative genitourinary   Musculoskeletal negative musculoskeletal ROS (+)    Abdominal   Peds negative pediatric ROS (+)  Hematology  (+) Blood dyscrasia, anemia   Anesthesia Other Findings   Reproductive/Obstetrics negative OB ROS                              Anesthesia Physical Anesthesia Plan  ASA: 3  Anesthesia Plan: General   Post-op Pain Management:    Induction: Intravenous  PONV Risk Score and Plan:   Airway Management Planned: Nasal Cannula  Additional Equipment:   Intra-op Plan:   Post-operative Plan:   Informed Consent: I have reviewed the patients History and Physical, chart, labs and discussed the procedure including the risks, benefits and alternatives for the proposed anesthesia with the patient or authorized representative who has indicated his/her understanding and acceptance.     Dental advisory given  Plan Discussed with: CRNA  Anesthesia Plan Comments:          Anesthesia Quick Evaluation

## 2023-12-09 NOTE — Anesthesia Postprocedure Evaluation (Signed)
 Anesthesia Post Note  Patient: Gabriella Hodges  Procedure(s) Performed: EGD (ESOPHAGOGASTRODUODENOSCOPY)  Patient location during evaluation: PACU Anesthesia Type: General Level of consciousness: awake and alert Pain management: pain level controlled Vital Signs Assessment: post-procedure vital signs reviewed and stable Respiratory status: spontaneous breathing, nonlabored ventilation, respiratory function stable and patient connected to nasal cannula oxygen Cardiovascular status: blood pressure returned to baseline and stable Postop Assessment: no apparent nausea or vomiting Anesthetic complications: no   No notable events documented.   Last Vitals:  Vitals:   12/09/23 1113 12/09/23 1124  BP: (!) 109/53 137/70  Pulse: 61   Resp: 14   Temp: 36.5 C   SpO2: 100%     Last Pain:  Vitals:   12/09/23 1113  TempSrc: Oral  PainSc: 0-No pain                 Andrea Limes

## 2023-12-09 NOTE — Op Note (Signed)
 [facility] Patient Name: Gabriella Hodges Procedure Date: 12/09/2023 10:44 AM MRN: [id number] Date of Birth: 06-Sep-1960 Attending MD: Carlin POUR. Cindie , [place], [id number] CSN: [id number] Age: 63 Admit Type: Outpatient Procedure:                Upper GI endoscopy Indications:              Epigastric abdominal pain, Dysphagia Providers:                Carlin POUR. Cindie, DO, Devere Lodge, Chad Wilson,                            Technician Referring MD:              Medicines:                See the Anesthesia note for documentation of the                            administered medications Complications:            No immediate complications. Estimated Blood Loss:     Estimated blood loss was minimal. Procedure:                Pre-Anesthesia Assessment:                           - The anesthesia plan was to use monitored                            anesthesia care (MAC).                           After obtaining informed consent, the endoscope was                            passed under direct vision. Throughout the                            procedure, the patient's blood pressure, pulse, and                            oxygen saturations were monitored continuously. The                            Endoscope was introduced through the mouth, and                            advanced to the efferent jejunal loop. The upper GI                            endoscopy was accomplished without difficulty. The                            patient tolerated the procedure well. Scope In: 11:02:23 AM Scope Out: 11:05:58 AM Total Procedure Duration: 0 hours 3 minutes 35 seconds  Findings:      Localized, white plaques were found in the upper third  of the esophagus       and in the middle third of the esophagus. Cells for cytology were       obtained by brushing.      Evidence of a Roux-en-Y gastrojejunostomy was found. The gastrojejunal       anastomosis was characterized by healthy appearing mucosa.  This was       traversed. The pouch-to-jejunum limb was characterized by healthy       appearing mucosa. The jejunojejunal anastomosis was characterized by       healthy appearing mucosa.      The examined jejunum was normal. Impression:               - Esophageal plaques were found, consistent with                            candidiasis. Cells for cytology obtained.                           - Roux-en-Y gastrojejunostomy with gastrojejunal                            anastomosis characterized by healthy appearing                            mucosa.                           - Normal examined jejunum. Moderate Sedation:      Per Anesthesia Care Recommendation:           - Patient has a contact number available for                            emergencies. The signs and symptoms of potential                            delayed complications were discussed with the                            patient. Return to normal activities tomorrow.                            Written discharge instructions were provided to the                            patient.                           - Resume previous diet.                           - Continue present medications.                           - Await pathology results.                           - Treat for candidal esophagitis if cytology  positive.                           - Return to GI clinic in 3 months. Procedure Code(s):        --- Professional ---                           [phone], Esophagogastroduodenoscopy, flexible,                            transoral; diagnostic, including collection of                            specimen(s) by brushing or washing, when performed                            (separate procedure) Diagnosis Code(s):        --- Professional ---                           K22.9, Disease of esophagus, unspecified                           Z98.0, Intestinal bypass and anastomosis status                            R10.13, Epigastric pain                           R13.10, Dysphagia, unspecified CPT copyright 2022 American Medical Association. All rights reserved. The codes documented in this report are preliminary and upon coder review may  be revised to meet current compliance requirements. Carlin POUR. Cindie, DO Carlin POUR. Terelle Dobler, DO 12/09/2023 11:10:47 AM This report has been signed electronically. Number of Addenda: 0

## 2023-12-09 NOTE — Discharge Instructions (Addendum)
 EGD Discharge instructions Please read the instructions outlined below and refer to this sheet in the next few weeks. These discharge instructions provide you with general information on caring for yourself after you leave the hospital. Your doctor may also give you specific instructions. While your treatment has been planned according to the most current medical practices available, unavoidable complications occasionally occur. If you have any problems or questions after discharge, please call your doctor. ACTIVITY You may resume your regular activity but move at a slower pace for the next 24 hours.  Take frequent rest periods for the next 24 hours.  Walking will help expel (get rid of) the air and reduce the bloated feeling in your abdomen.  No driving for 24 hours (because of the anesthesia (medicine) used during the test).  You may shower.  Do not sign any important legal documents or operate any machinery for 24 hours (because of the anesthesia used during the test).  NUTRITION Drink plenty of fluids.  You may resume your normal diet.  Begin with a light meal and progress to your normal diet.  Avoid alcoholic beverages for 24 hours or as instructed by your caregiver.  MEDICATIONS You may resume your normal medications unless your caregiver tells you otherwise.  WHAT YOU CAN EXPECT TODAY You may experience abdominal discomfort such as a feeling of fullness or "gas" pains.  FOLLOW-UP Your doctor will discuss the results of your test with you.  SEEK IMMEDIATE MEDICAL ATTENTION IF ANY OF THE FOLLOWING OCCUR: Excessive nausea (feeling sick to your stomach) and/or vomiting.  Severe abdominal pain and distention (swelling).  Trouble swallowing.  Temperature over 101 F (37.8 C).  Rectal bleeding or vomiting of blood.   Your upper endoscopy revealed findings consistent with candidal esophagitis.  This is a yeast infection.  I took samples of your esophagus today.  Previous bypass surgery  looked healthy.  Examined stomach pouch and small bowel appeared normal.  Await pathology results, we will likely need to treat this with a 14-day course of Diflucan.  It is important that you gargle water in your mouth and spit after using your inhaler.  Follow-up in GI office in 2 to 3 months.   I hope you have a great rest of your week!  Carlin POUR. Cindie, D.O. Gastroenterology and Hepatology [facility]

## 2023-12-09 NOTE — Interval H&P Note (Signed)
 History and Physical Interval Note:  12/09/2023 9:59 AM  Gabriella Hodges  has presented today for surgery, with the diagnosis of dysphagia, dyspepsia.  The various methods of treatment have been discussed with the patient and family. After consideration of risks, benefits and other options for treatment, the patient has consented to  Procedure(s) with comments: EGD (ESOPHAGOGASTRODUODENOSCOPY) (N/A) - 1030am, asa 3 DILATION, ESOPHAGUS (N/A) as a surgical intervention.  The patient's history has been reviewed, patient examined, no change in status, stable for surgery.  I have reviewed the patient's chart and labs.  Questions were answered to the patient's satisfaction.     Carlin MARLA Hasty

## 2023-12-11 ENCOUNTER — Encounter (HOSPITAL_COMMUNITY): Payer: Self-pay | Admitting: Internal Medicine

## 2023-12-17 ENCOUNTER — Ambulatory Visit: Admitting: Nurse Practitioner

## 2023-12-19 ENCOUNTER — Ambulatory Visit (HOSPITAL_COMMUNITY)
Admission: RE | Admit: 2023-12-19 | Discharge: 2023-12-19 | Disposition: A | Discharge status: Home / Self Care | Source: Ambulatory Visit | Attending: Acute Care | Admitting: Acute Care

## 2023-12-19 DIAGNOSIS — F17210 Nicotine dependence, cigarettes, uncomplicated: Secondary | ICD-10-CM | POA: Insufficient documentation

## 2023-12-19 DIAGNOSIS — Z122 Encounter for screening for malignant neoplasm of respiratory organs: Secondary | ICD-10-CM | POA: Insufficient documentation

## 2023-12-19 DIAGNOSIS — Z87891 Personal history of nicotine dependence: Secondary | ICD-10-CM | POA: Insufficient documentation

## 2023-12-23 NOTE — H&P (Signed)
°  Patient: Gabriella Hodges  PID: [id number]  DOB: 02/14/1960  SEX: Female   Patient referred by DDS for extraction teeth # 2, 7, 8, 9, 10, 11, 15, 16, 17, 31, 32.  CC: Bad teeth.  Past Medical History:  Chest Pain or Angina, Irregular Heart Beat, Sinus Issues, Difficult breathing, Emphysema, COPD, Smoker, Anemia, Liver issues, Epilepsy, Hypothyroid, Arthritis, Acid Reflux, Stomach Ulcers, Immune System Problem, GERD    Medications: Famotidine , levothyroxine , Ventolin , Ibuprofen, Omeprazole , Phenobarbital     Allergies:     Sulfa, Penicillin    Surgeries:   Thyroid tumor removed, Tumor Removal, Gastric Bypass     Social History       Smoking:            Alcohol: Drug use:                             Exam: BMI 22. Multiple caries teeth #'s 2, 7, 8, 9, 10, 11, 15, 16, 17, 31, 32.  No purulence, edema, fluctuance, trismus. Oral cancer screening negative. Pharynx clear. No lymphadenopathy.  Panorex: Multiple caries teeth #'s 2, 7, 8, 9, 10, 11, 15, 16, 17, 31, 32.   Assessment: ASA 3. Non-restorable  teeth #'s 2, 7, 8, 9, 10, 11, 15, 16, 17, 31, 32.              Plan: Extraction Teeth # 2, 7, 8, 9, 10, 11, 15, 16, 17, 31, 32.  Alveoloplasty.   Hospital Day surgery.                 Rx: n               Risks and complications explained. Questions answered.   Glendia EMERSON Primrose, DMD

## 2023-12-24 ENCOUNTER — Other Ambulatory Visit: Payer: Self-pay | Admitting: Acute Care

## 2023-12-24 ENCOUNTER — Ambulatory Visit: Admitting: Internal Medicine

## 2023-12-24 ENCOUNTER — Encounter: Payer: Self-pay | Admitting: Internal Medicine

## 2023-12-24 ENCOUNTER — Ambulatory Visit (HOSPITAL_COMMUNITY): Admission: RE | Admit: 2023-12-24 | Discharge: 2023-12-24 | Attending: Internal Medicine | Admitting: Internal Medicine

## 2023-12-24 VITALS — BP 149/76 | HR 85 | Ht 63.0 in | Wt 143.0 lb

## 2023-12-24 DIAGNOSIS — R058 Other specified cough: Secondary | ICD-10-CM

## 2023-12-24 DIAGNOSIS — F17210 Nicotine dependence, cigarettes, uncomplicated: Secondary | ICD-10-CM

## 2023-12-24 DIAGNOSIS — Z122 Encounter for screening for malignant neoplasm of respiratory organs: Secondary | ICD-10-CM

## 2023-12-24 DIAGNOSIS — Z87891 Personal history of nicotine dependence: Secondary | ICD-10-CM

## 2023-12-24 DIAGNOSIS — J449 Chronic obstructive pulmonary disease, unspecified: Secondary | ICD-10-CM

## 2023-12-24 LAB — PULMONARY FUNCTION TEST
DL/VA % pred: 85 %
DL/VA: 3.59 ml/min/mmHg/L
DLCO unc % pred: 66 %
DLCO unc: 12.65 ml/min/mmHg
FEF 25-75 Post: 1.75 L/s
FEF 25-75 Pre: 1.89 L/s
FEF2575-%Change-Post: -7 %
FEF2575-%Pred-Post: 82 %
FEF2575-%Pred-Pre: 89 %
FEV1-%Change-Post: -3 %
FEV1-%Pred-Post: 71 %
FEV1-%Pred-Pre: 73 %
FEV1-Post: 1.68 L
FEV1-Pre: 1.73 L
FEV1FVC-%Change-Post: 3 %
FEV1FVC-%Pred-Pre: 107 %
FEV6-%Change-Post: -7 %
FEV6-%Pred-Post: 65 %
FEV6-%Pred-Pre: 70 %
FEV6-Post: 1.92 L
FEV6-Pre: 2.08 L
FEV6FVC-%Change-Post: -1 %
FEV6FVC-%Pred-Post: 102 %
FEV6FVC-%Pred-Pre: 103 %
FVC-%Change-Post: -6 %
FVC-%Pred-Post: 63 %
FVC-%Pred-Pre: 67 %
FVC-Post: 1.96 L
FVC-Pre: 2.08 L
Post FEV1/FVC ratio: 86 %
Post FEV6/FVC ratio: 99 %
Pre FEV1/FVC ratio: 83 %
Pre FEV6/FVC Ratio: 100 %
RV % pred: 85 %
RV: 1.73 L
TLC % pred: 79 %
TLC: 3.89 L

## 2023-12-24 MED ORDER — BUDESONIDE-FORMOTEROL FUMARATE 80-4.5 MCG/ACT IN AERO: INHALATION_SPRAY | RESPIRATORY_TRACT | 12 refills | Status: AC

## 2023-12-24 MED ORDER — ALBUTEROL SULFATE (2.5 MG/3ML) 0.083% IN NEBU
2.5000 mg | INHALATION_SOLUTION | Freq: Once | RESPIRATORY_TRACT | Status: AC
  Administered 2023-12-24: 09:00:00 2.5 mg via RESPIRATORY_TRACT

## 2023-12-24 NOTE — Assessment & Plan Note (Addendum)
 Active smoker -  11/20/2022  After extensive coaching inhaler device,  effectiveness =    60% (short ti) > try breztri  and approp saba  -  12/18/22 [facility]DS[facility]  RADS 2/ Centrilobular emphysema  - 03/18/2023  After extensive coaching inhaler device,  effectiveness =    60% hfa > continue breztri  - 09/23/2023  After extensive coaching inhaler device,  effectiveness =    near 0% at baseline  maybe 50% with coaching - 09/23/2023   Walked on RA  x  2  lap(s) =  approx 300  ft  @  mod  pace, stopped due to tired with lowest 02 sats 97%   and min sob  - PFT's  12/24/2023   FEV1 1.73 (73 % ) ratio 0.83  p 0 % improvement from saba p 0 prior to study with DLCO  12.65 (66%)   and FV curve nl    - 12/24/2023   Walked on RA  x  2  lap(s) =  approx 300  ft  @  mod pace, stopped due to back pain  with lowest 02 sats 96%   So she does not meet the criteria for copd despite hx suggesting AB and ct c/w mod emphysema but main symptoms are AB related so try Symbicort  80 or dulera 100 dosed up to  2bid prn Based on two studies from NEJM  378; 20 p 1865 (2018) and 380 : p2020-30 (2019) in pts with mild asthma it is reasonable to use low dose symbicort  eg 80 2bid prn flare in this setting but I emphasized this was only shown with symbicort  (and by inference dulera 1000 here) and takes advantage of the rapid onset of action but is not the same as rescue therapy but can be stopped once the acute symptoms have resolved and the need for rescue has been minimized (< 2 x weekly)    Discussed in detail all the  indications, usual  risks and alternatives  relative to the benefits with patient who agrees to proceed with Rx as outlined.      Each maintenance medication was reviewed in detail including emphasizing most importantly the difference between maintenance and prns and under what circumstances the prns are to be triggered using an action plan format where appropriate.  Total time for H and P, chart review, counseling, reviewing  hfa device(s) , directly observing portions of ambulatory 02 saturation study/ and generating customized AVS unique to this office visit / same day charting = 35 min

## 2023-12-24 NOTE — Patient Instructions (Addendum)
 Stop Breztri    Start symbicort   80 (breyna  80 or dulera 100 )  up to 2 puff every 12 hours   Please remember to go to the lab department   for your tests - we will call you with the results when they are available.      Please schedule a follow up office visit in 6 weeks, call sooner if needed with all medications /inhalers/ solutions in hand so we can verify exactly what you are taking. This includes all medications from all doctors and over the counters     .

## 2023-12-24 NOTE — Assessment & Plan Note (Addendum)
 5  min discussion re active cigarette smoking in addition to office E&M  Ask about tobacco use:   ongoing  Advise quitting     I reviewed the Fletcher curve with the patient that basically indicates that  if you quit smoking when your best day FEV1 is still well preserved (as is clearly   the case here)  it is highly unlikely you will progress to severe disease and informed the patient there was  no medication on the market that has proven to alter the curve/ its downward trajectory  or the likelihood of progression of their disease(unlike other chronic medical conditions such as atheroclerosis where we do think we can change the natural hx with risk reducing meds)    Therefore stopping smoking and maintaining abstinence are  the most important aspects of her  care, not choice of inhalers or for that matter, Pulmonary doctors.  Assess willingness:  Not committed at this point Assist in quit attempt:  Per PCP when ready Arrange follow up:   Follow up per Primary Care planned

## 2023-12-24 NOTE — Progress Notes (Signed)
 Gabriella Hodges, female    DOB: Mar 17, 1960    MRN: [id number]   Brief patient profile:  26  yowf   active smoker  referred to pulmonary clinic in [place]  11/20/2022 by Dr Marvine  for doe and cough x 2023  indolent onset and mildly progressive, some better p rx with ventolin  prn     History of Present Illness  11/20/2022  Pulmonary/ 1st office eval/ Ender Rorke / [place] [facility]  Chief Complaint  Patient presents with   Establish Care  Dyspnea:  raking leaves x 30 min  vs sev hours prior to onset  Cough:   thick and sometimes green Sleep: sleeps in chair x 8 y  p thyroid surgery for cancer [facility]  SABA use: avg 2 x daily  02: none  Lung cancer screen: referred 11/20/2022  Rec Plan A = Automatic = Always=    Breztri  Take 2 puffs first thing in am and then another 2 puffs about 12 hours later.  Work on inhaler technique:  Plan B = Backup (to supplement plan A, not to replace it) Only use your albuterol  inhaler as a rescue medication  For cough/ congestion > mucinex or mucinex dm  up to maximum of  1200 mg every 12 hours as needed  Omeprzole 40 mg   Take  30-60 min before first meal of the day and Pepcid  (famotidine )  20 mg after supper until return to office      12/18/22 LDSCT  RADS 2/ Centrilobular emphysema   Admit date: 02/18/2023 Discharge date: 02/19/2023 Recommendations for Outpatient Follow-up:  Follow up with PCP in 1-2 weeks, repeat BMP in 1 week. Continue on Tamiflu  with complete course of treatment Given 1 dose of fosfomycin  for UTI prior to discharge. Continue other home medications as prior   Diet recommendation: Heart Healthy   Brief/Interim Summary: Gabriella Hodges is a 63 y.o. female with medical history significant of vertigo, seizure disorder, iron  deficiency anemia, COPD, tobacco usage, and hypothyroidism.Patient presented to the ED on 2/8 with complaints of headache, sore throat, productive cough with green sputum for several days.  She has also had  dysuria which apparently is a chronic problem but has no history of recurrent UTI.  She was admitted for acute COPD exacerbation secondary to influenza A infection along with concerns for UTI.  She is overall doing much better and did not require any oxygen supplementation during the course of this admission.  Her lung sounds are back to baseline and she continues to have some mild dysuria for which she will be given fosfomycin  prior to discharge.  No other acute events or concerns noted aside from some hypokalemia which has now resolved.  Etiology for this is unclear and she will need to follow-up with PCP to repeat BMP in 1 week.      ENT eval 05/22/23 no specific findings rec  Prescribed Flonase  nasal spray twice daily. - Prescribed Xyzal  at night. - Continue omeprazole . - Recommend Reflux Gourmet supplement for GERD. - Consider allergy testing to exclude allergies as a cause of drainage.     09/23/2023  f/u ov/Gabriella Hodges re:  emphysema on CT/presumed copd   maint on Breztri  prn  smoker  Chief Complaint  Patient presents with   Shortness of Breath    In hosp for shob   Dyspnea:  worse with onset of recurrent cp used to come and go (x 5 years) and now and stayed  x 3 weeks  located epigastrium  worse p eating each meal regardless of type of food or time of day  Cough: none  Sleeping: stays upright always /never lies down   x years  SABA use: avg use  02: none  Lung cancer screening: due q dec Patient Instructions  Plan A = Automatic = Always=    Breztri  Take 2 puffs first thing in am and then another 2 puffs about 12 hours later.   Work on inhaler technique:    Plan B = Backup (to supplement plan A, not to replace it) Use your albuterol  inhaler as a rescue medication Try to lie down when pain bothers you to see if it goes away within about 10 minutes and if so it is Gas  -try GAS X  Treatment consists of avoiding foods that cause gas (especially boiled eggs, mexcican food but  especially  beans and undercooked vegetables like  spinach and some salads)   - see DR Quintin when you can  Stop ibuprofen and just use  Tylenol  Arthritis     LDSCT  12/19/23 RADS 2  Moderate centrilobular emphysema.   12/24/2023  f/u ov/[place] office/Gabriella Hodges re: doe and cough x 2023 with mod emphysema on LDSCt  maint on breztri    still  smoking    Chief Complaint  Patient presents with   COPD    Pft @ [address] - coughing    Dyspnea:  [employer] shopping uses [facility]  Cough: mostly nonproductive  Sleeping: sleeps sitting up x years  SABA use: none  02: none    No obvious day to day or daytime variability or assoc excess/ purulent sputum or mucus plugs or hemoptysis or cp or chest tightness, subjective wheeze or overt sinus or hb symptoms.    Also denies any obvious fluctuation of symptoms with weather or environmental changes or other aggravating or alleviating factors except as outlined above   No unusual exposure hx or h/o childhood pna/ asthma or knowledge of premature birth.  Current Allergies, Complete Past Medical History, Past Surgical History, Family History, and Social History were reviewed in [employer] record.  ROS  The following are not active complaints unless bolded Hoarseness, sore throat, dysphagia, dental problems, itching, sneezing,  nasal congestion or discharge of excess mucus or purulent secretions, ear ache,   fever, chills, sweats, unintended wt loss or wt gain, classically pleuritic or exertional cp,  orthopnea pnd or arm/hand swelling  or leg swelling, presyncope, palpitations, abdominal pain, anorexia, nausea, vomiting, diarrhea  or change in bowel habits or change in bladder habits, change in stools or change in urine, dysuria, hematuria,  rash, arthralgias, visual complaints, headache, numbness, weakness or ataxia or problems with walking or coordination,  change in mood or  memory.         Outpatient Medications Prior to Visit   Medication Sig Dispense Refill   Budeson-Glycopyrrol-Formoterol  (BREZTRI  AEROSPHERE) 160-9-4.8 MCG/ACT AERO Take 2 puffs first thing in am and then another 2 puffs about 12 hours later. 10.7 g 11   docusate sodium  (COLACE) 100 MG capsule Take 100 mg by mouth daily.     famotidine  (PEPCID ) 20 MG tablet One after supper 30 tablet 11   fluticasone  (FLONASE ) 50 MCG/ACT nasal spray Place 2 sprays into both nostrils 2 (two) times daily. 16 g 6   ibuprofen (ADVIL) 200 MG tablet Take 200-800 mg by mouth every 6 (six) hours as needed for moderate pain (pain score 4-6).     levocetirizine (XYZAL  ALLERGY 24HR)  5 MG tablet Take 1 tablet (5 mg total) by mouth every evening. 30 tablet 3   levothyroxine  (SYNTHROID ) 200 MCG tablet Take 200 mcg by mouth daily before breakfast.     losartan  (COZAAR ) 25 MG tablet Take 1 tablet (25 mg total) by mouth daily. 90 tablet 3   omeprazole  (PRILOSEC) 40 MG capsule Take 1 capsule (40 mg total) by mouth daily. 30 capsule 0   pantoprazole  (PROTONIX ) 40 MG tablet Take 1 tablet (40 mg total) by mouth 2 (two) times daily before a meal. 30 minutes before meals 180 tablet 3   PHENobarbital  (LUMINAL) 97.2 MG tablet Take 97.2 mg by mouth at bedtime.       VENTOLIN  HFA 108 (90 Base) MCG/ACT inhaler Inhale 2 puffs into the lungs every 4 (four) hours as needed.     No facility-administered medications prior to visit.       Past Medical History:  Diagnosis Date   Anxiety    Epilepsy (HCC)    GERD (gastroesophageal reflux disease)    Hypothyroidism    Thyroid disease     Objective:    Wts  12/24/2023      143  09/23/2023        153  06/24/2023        162   03/18/2023        164   02/18/23 165 lb (74.8 kg)  11/26/22 173 lb 11.6 oz (78.8 kg)  11/20/22 175 lb (79.4 kg)    Vital signs reviewed  12/24/2023  - Note at rest 02 sats  97% on RA   General appearance: somber wf nad/   UA  insp noise / hoarse    HEENT : Oropharynx  clear/ poor dentition  > plans on extraction  w/in a week   NECK :  without  apparent JVD/ palpable Nodes/scar base of neck/ ss notch with noisy inspiration but not stridorous    LUNGS: no acc muscle use,  Nl contour chest which is clear to A and P bilaterally without cough on insp or exp maneuvers   CV:  RRR  no s3 or murmur or increase in P2, and no edema   ABD:  soft and nontender   MS:  Gait nl   ext warm without deformities Or obvious joint restrictions  calf tenderness, cyanosis or clubbing    SKIN: warm and dry without lesions    NEURO:  alert, approp, nl sensorium with  no motor or cerebellar deficits apparent.           Assessment   Assessment & Plan COPD GOLD ? / AB Active smoker -  11/20/2022  After extensive coaching inhaler device,  effectiveness =    60% (short ti) > try breztri  and approp saba  -  12/18/22 LDSCT  RADS 2/ Centrilobular emphysema  - 03/18/2023  After extensive coaching inhaler device,  effectiveness =    60% hfa > continue breztri  - 09/23/2023  After extensive coaching inhaler device,  effectiveness =    near 0% at baseline  maybe 50% with coaching - 09/23/2023   Walked on RA  x  2  lap(s) =  approx 300  ft  @  mod  pace, stopped due to tired with lowest 02 sats 97%   and min sob  - PFT's  12/24/2023   FEV1 1.73 (73 % ) ratio 0.83  p 0 % improvement from saba p 0 prior to study with DLCO  12.65 (66%)  and FV curve nl    - 12/24/2023   Walked on RA  x  2  lap(s) =  approx 300  ft  @  mod pace, stopped due to back pain  with lowest 02 sats 96%   So she does not meet the criteria for copd despite hx suggesting AB and ct c/w mod emphysema but main symptoms are AB related so try Symbicort  80 or dulera 100 dosed up to  2bid prn Based on two studies from NEJM  378; 20 p 1865 (2018) and 380 : p2020-30 (2019) in pts with mild asthma it is reasonable to use low dose symbicort  eg 80 2bid prn flare in this setting but I emphasized this was only shown with symbicort  (and by inference dulera 1000 here) and  takes advantage of the rapid onset of action but is not the same as rescue therapy but can be stopped once the acute symptoms have resolved and the need for rescue has been minimized (< 2 x weekly)    Discussed in detail all the  indications, usual  risks and alternatives  relative to the benefits with patient who agrees to proceed with Rx as outlined.      Each maintenance medication was reviewed in detail including emphasizing most importantly the difference between maintenance and prns and under what circumstances the prns are to be triggered using an action plan format where appropriate.  Total time for H and P, chart review, counseling, reviewing hfa device(s) , directly observing portions of ambulatory 02 saturation study/ and generating customized AVS unique to this office visit / same day charting = 35 min              Cigarette smoker 5  min discussion re active cigarette smoking in addition to office E&M  Ask about tobacco use:   ongoing  Advise quitting     I reviewed the Fletcher curve with the patient that basically indicates that  if you quit smoking when your best day FEV1 is still well preserved (as is clearly   the case here)  it is highly unlikely you will progress to severe disease and informed the patient there was  no medication on the market that has proven to alter the curve/ its downward trajectory  or the likelihood of progression of their disease(unlike other chronic medical conditions such as atheroclerosis where we do think we can change the natural hx with risk reducing meds)    Therefore stopping smoking and maintaining abstinence are  the most important aspects of her  care, not choice of inhalers or for that matter, Pulmonary doctors.  Assess willingness:  Not committed at this point Assist in quit attempt:  Per PCP when ready Arrange follow up:   Follow up per Primary Care planned         AVS  Patient Instructions  Stop Breztri    Start symbicort   80  (breyna  80 or dulera 100 )  up to 2 puff every 12 hours   Please remember to go to the lab department   for your tests - we will call you with the results when they are available.      Please schedule a follow up office visit in 6 weeks, call sooner if needed with all medications /inhalers/ solutions in hand so we can verify exactly what you are taking. This includes all medications from all doctors and over the counters     .   Ozell America, MD 12/24/2023

## 2023-12-25 ENCOUNTER — Ambulatory Visit: Payer: Self-pay | Admitting: Internal Medicine

## 2023-12-26 ENCOUNTER — Telehealth: Payer: Self-pay | Admitting: Internal Medicine

## 2023-12-26 MED ORDER — FLUCONAZOLE 200 MG PO TABS: ORAL_TABLET | ORAL | 0 refills | Status: AC

## 2023-12-26 NOTE — Telephone Encounter (Signed)
 Patient has esophageal candidiasis.  I will send in 14-day course of fluconazole .  Take 2 tablets day 1 and then 1 tablet daily for a total 14 days.  Follow-up as previously scheduled.

## 2023-12-27 LAB — IGE: IgE (Immunoglobulin E), Serum: 46 [IU]/mL (ref 6–495)

## 2023-12-30 NOTE — Telephone Encounter (Signed)
 Phoned and advised the pt of her result note / instructions to medications. Pt expressed understanding

## 2023-12-31 LAB — ALPHA-1-ANTITRYPSIN PHENOTYP: A-1 Antitrypsin: 127 mg/dL (ref 101–187)

## 2024-01-02 ENCOUNTER — Ambulatory Visit: Payer: Self-pay | Admitting: Internal Medicine

## 2024-01-03 ENCOUNTER — Encounter (HOSPITAL_COMMUNITY): Payer: Self-pay | Admitting: Oral Surgery

## 2024-01-03 ENCOUNTER — Other Ambulatory Visit: Payer: Self-pay

## 2024-01-03 NOTE — Progress Notes (Signed)
 SDW CALL  Patient was given pre-op instructions over the phone. The opportunity was given for the patient to ask questions. No further questions asked. Patient verbalized understanding of instructions given.   PCP - Bacchus, Meade PEDLAR, FNP  Cardiologist - Mallipeddi, Diannah SQUIBB, MD   PPM/ICD - denies Device Orders -  Rep Notified -   Chest x-ray - CT 12/23/23 EKG - 10/29/23 Stress Test - 02/02/02 ECHO - 11/14/23 Cardiac Cath -   Sleep Study - denies CPAP -   Fasting Blood Sugar - na Checks Blood Sugar _____ times a day  Blood Thinner Instructions:na Aspirin Instructions:na  ERAS Protcol -NPO PRE-SURGERY Ensure or G2-   COVID TEST- na   Anesthesia review: yes-cardiac clearance 10/29/23,hx HTN,HLD,Smoker,COPD,epilepsy  Patient denies shortness of breath, fever, cough and chest pain over the phone call   As of today, STOP taking any Aspirin (unless otherwise instructed by your surgeon) Aleve, Naproxen, Ibuprofen, Motrin, Advil, Goody's, BC's, all herbal medications, fish oil, and all vitamins.  Special instructions:    Oral Hygiene is also important to reduce your risk of infection.  Remember - BRUSH YOUR TEETH THE MORNING OF SURGERY WITH YOUR REGULAR TOOTHPASTE

## 2024-01-06 ENCOUNTER — Encounter: Payer: Self-pay | Admitting: *Deleted

## 2024-01-06 NOTE — Progress Notes (Signed)
 Anesthesia Chart Review: Same day workup  63 year old female current smoker follows with cardiology for history of frequent PVCs, HTN, palpitations, atypical chest pain.  Seen by Almarie Crate, NP 10/29/2023 for preop evaluation.  Per note, Ms. Camilo's perioperative risk of a major cardiac event is 0.4% according to the Revised Cardiac Risk Index (RCRI).  Therefore, she is at low risk for perioperative complications.   Her functional capacity is excellent at 5.93 METs according to the Duke Activity Status Index (DASI). Recommendations: The patient requires an echocardiogram before a disposition can be made regarding surgical risk. Antiplatelet and/or Anticoagulation Recommendations: She is not on any anticoagulation or antiplatelet medication that needs to be held prior to procedure.  Once echocardiogram report comes back benign/WNL, will update chart and send note to requesting party. Addendum 11/18/2023:  Echo is benign and she is okay to proceed to surgery.  Other pertinent history includes emphysema/asthmatic bronchitis on Symbicort  and as needed albuterol , GERD on H2 blocker and PPI, hypothyroidism, epilepsy maintained on phenobarbital .  Pt will need DOS labs and evaluation.  EKG 10/29/2023: NSR.  Rate 62.  TTE 11/14/2023: 1. Left ventricular ejection fraction, by estimation, is 60 to 65%. Left  ventricular ejection fraction by 3D volume is 66 %. The left ventricle has  normal function. The left ventricle has no regional wall motion  abnormalities. Left ventricular diastolic   parameters are consistent with Grade I diastolic dysfunction (impaired  relaxation). The average left ventricular global longitudinal strain is  -18.9 %. The global longitudinal strain is normal.   2. Right ventricular systolic function is normal. The right ventricular  size is normal. There is mildly elevated pulmonary artery systolic  pressure.   3. Left atrial size was moderately dilated.   4. The mitral  valve is normal in structure. Trivial mitral valve  regurgitation. No evidence of mitral stenosis.   5. The aortic valve is tricuspid. Aortic valve regurgitation is not  visualized. No aortic stenosis is present.   6. The inferior vena cava is normal in size with greater than 50%  respiratory variability, suggesting right atrial pressure of 3 mmHg.   Carotid duplex 11/14/2023: Summary:  Right Carotid: There is no evidence of stenosis in the right ICA. The ECA  appears <50% stenosed.  Left Carotid: Velocities in the left ICA are consistent with a 1-39% stenosis. Non-hemodynamically significant plaque <50% noted in the CCA. The ECA appears <50% stenosed.  Vertebrals:  Bilateral vertebral arteries demonstrate antegrade flow.  Subclavians: Normal flow hemodynamics were seen in bilateral subclavian arteries.     Lynwood Geofm RIGGERS [facility]/Anesthesiology Phone [phone] 01/06/2024 11:15 AM

## 2024-01-06 NOTE — Anesthesia Preprocedure Evaluation (Signed)
 "                                  Anesthesia Evaluation  Patient identified by MRN, date of birth, ID band Patient awake    Reviewed: Allergy & Precautions, NPO status , Patient's Chart, lab work & pertinent test results, reviewed documented beta blocker date and time   History of Anesthesia Complications Negative for: history of anesthetic complications  Airway Mallampati: II  TM Distance: >3 FB     Dental no notable dental hx. (+) Poor Dentition, Missing, Chipped   Pulmonary neg shortness of breath, COPD,  COPD inhaler, Current Smoker and Patient abstained from smoking.   breath sounds clear to auscultation       Cardiovascular (-) CAD, (-) Past MI, (-) Cardiac Stents and (-) CABG  Rhythm:Regular Rate:Normal  IMPRESSIONS     1. Left ventricular ejection fraction, by estimation, is 60 to 65%. Left  ventricular ejection fraction by 3D volume is 66 %. The left ventricle has  normal function. The left ventricle has no regional wall motion  abnormalities. Left ventricular diastolic   parameters are consistent with Grade I diastolic dysfunction (impaired  relaxation). The average left ventricular global longitudinal strain is  -18.9 %. The global longitudinal strain is normal.   2. Right ventricular systolic function is normal. The right ventricular  size is normal. There is mildly elevated pulmonary artery systolic  pressure.   3. Left atrial size was moderately dilated.   4. The mitral valve is normal in structure. Trivial mitral valve  regurgitation. No evidence of mitral stenosis.   5. The aortic valve is tricuspid. Aortic valve regurgitation is not  visualized. No aortic stenosis is present.   6. The inferior vena cava is normal in size with greater than 50%  respiratory variability, suggesting right atrial pressure of 3 mmHg.   Comparison(s): No prior Echocardiogram.     Neuro/Psych Seizures -, Poorly Controlled,   Anxiety        GI/Hepatic ,GERD   Medicated and Controlled,,(+) neg Cirrhosis        Endo/Other  Hypothyroidism    Renal/GU Renal disease     Musculoskeletal   Abdominal   Peds  Hematology  (+) Blood dyscrasia   Anesthesia Other Findings   Reproductive/Obstetrics                              Anesthesia Physical Anesthesia Plan  ASA: 3  Anesthesia Plan: General   Post-op Pain Management:    Induction: Intravenous  PONV Risk Score and Plan: 2 and Dexamethasone and Ondansetron   Airway Management Planned: Nasal ETT  Additional Equipment:   Intra-op Plan:   Post-operative Plan: Extubation in OR  Informed Consent: I have reviewed the patients History and Physical, chart, labs and discussed the procedure including the risks, benefits and alternatives for the proposed anesthesia with the patient or authorized representative who has indicated his/her understanding and acceptance.     Dental advisory given  Plan Discussed with: CRNA  Anesthesia Plan Comments: (PAT note by Lynwood Hope, PA-C:  63 year old female current smoker follows with cardiology for history of frequent PVCs, HTN, palpitations, atypical chest pain.  Seen by Almarie Crate, NP 10/29/2023 for preop evaluation.  Per note, Ms. Venables's perioperative risk of a major cardiac event is 0.4% according to the Revised Cardiac Risk Index (RCRI).  Therefore, she is at low risk for perioperative complications.   Her functional capacity is excellent at 5.93 METs according to the Duke Activity Status Index (DASI). Recommendations: The patient requires an echocardiogram before a disposition can be made regarding surgical risk. Antiplatelet and/or Anticoagulation Recommendations: She is not on any anticoagulation or antiplatelet medication that needs to be held prior to procedure.  Once echocardiogram report comes back benign/WNL, will update chart and send note to requesting party. Addendum 11/18/2023:  Echo is benign and she  is okay to proceed to surgery.  Other pertinent history includes emphysema/asthmatic bronchitis on Symbicort  and as needed albuterol , GERD on H2 blocker and PPI, hypothyroidism, epilepsy maintained on phenobarbital .  Pt will need DOS labs and evaluation.  EKG 10/29/2023: NSR.  Rate 62.  TTE 11/14/2023: 1. Left ventricular ejection fraction, by estimation, is 60 to 65%. Left  ventricular ejection fraction by 3D volume is 66 %. The left ventricle has  normal function. The left ventricle has no regional wall motion  abnormalities. Left ventricular diastolic  parameters are consistent with Grade I diastolic dysfunction (impaired  relaxation). The average left ventricular global longitudinal strain is  -18.9 %. The global longitudinal strain is normal.  2. Right ventricular systolic function is normal. The right ventricular  size is normal. There is mildly elevated pulmonary artery systolic  pressure.  3. Left atrial size was moderately dilated.  4. The mitral valve is normal in structure. Trivial mitral valve  regurgitation. No evidence of mitral stenosis.  5. The aortic valve is tricuspid. Aortic valve regurgitation is not  visualized. No aortic stenosis is present.  6. The inferior vena cava is normal in size with greater than 50%  respiratory variability, suggesting right atrial pressure of 3 mmHg.   Carotid duplex 11/14/2023: Summary:  Right Carotid: There is no evidence of stenosis in the right ICA. The ECA  appears <50% stenosed.  Left Carotid: Velocities in the left ICA are consistent with a 1-39% stenosis. Non-hemodynamically significant plaque <50% noted in the CCA. TheECA appears <50% stenosed.  Vertebrals: Bilateral vertebral arteries demonstrate antegrade flow.  Subclavians: Normal flow hemodynamics were seen in bilateral subclavian arteries.  )         Anesthesia Quick Evaluation  "

## 2024-01-10 ENCOUNTER — Ambulatory Visit (HOSPITAL_COMMUNITY): Payer: Self-pay | Admitting: Physician Assistant

## 2024-01-10 ENCOUNTER — Encounter (HOSPITAL_COMMUNITY)
Admission: RE | Disposition: A | Discharge status: Home / Self Care | Payer: Self-pay | Source: Home / Self Care | Attending: Oral Surgery

## 2024-01-10 ENCOUNTER — Ambulatory Visit (HOSPITAL_COMMUNITY)
Admission: RE | Admit: 2024-01-10 | Discharge: 2024-01-10 | Disposition: A | Discharge status: Home / Self Care | Attending: Oral Surgery | Admitting: Oral Surgery

## 2024-01-10 DIAGNOSIS — K219 Gastro-esophageal reflux disease without esophagitis: Secondary | ICD-10-CM | POA: Insufficient documentation

## 2024-01-10 DIAGNOSIS — D759 Disease of blood and blood-forming organs, unspecified: Secondary | ICD-10-CM | POA: Diagnosis not present

## 2024-01-10 DIAGNOSIS — F419 Anxiety disorder, unspecified: Secondary | ICD-10-CM

## 2024-01-10 DIAGNOSIS — G40909 Epilepsy, unspecified, not intractable, without status epilepticus: Secondary | ICD-10-CM | POA: Insufficient documentation

## 2024-01-10 DIAGNOSIS — K0889 Other specified disorders of teeth and supporting structures: Secondary | ICD-10-CM

## 2024-01-10 DIAGNOSIS — F17200 Nicotine dependence, unspecified, uncomplicated: Secondary | ICD-10-CM | POA: Insufficient documentation

## 2024-01-10 DIAGNOSIS — J439 Emphysema, unspecified: Secondary | ICD-10-CM

## 2024-01-10 DIAGNOSIS — E039 Hypothyroidism, unspecified: Secondary | ICD-10-CM

## 2024-01-10 DIAGNOSIS — K029 Dental caries, unspecified: Secondary | ICD-10-CM | POA: Diagnosis present

## 2024-01-10 DIAGNOSIS — I10 Essential (primary) hypertension: Secondary | ICD-10-CM | POA: Diagnosis not present

## 2024-01-10 DIAGNOSIS — J4489 Other specified chronic obstructive pulmonary disease: Secondary | ICD-10-CM | POA: Insufficient documentation

## 2024-01-10 DIAGNOSIS — Z79899 Other long term (current) drug therapy: Secondary | ICD-10-CM | POA: Diagnosis not present

## 2024-01-10 DIAGNOSIS — N289 Disorder of kidney and ureter, unspecified: Secondary | ICD-10-CM | POA: Diagnosis not present

## 2024-01-10 HISTORY — PX: MULTIPLE EXTRACTIONS WITH ALVEOLOPLASTY: SHX5342

## 2024-01-10 HISTORY — DX: Anemia, unspecified: D64.9

## 2024-01-10 HISTORY — DX: Chronic obstructive pulmonary disease, unspecified: J44.9

## 2024-01-10 LAB — POCT I-STAT EG7
Acid-base deficit: 1 mmol/L (ref 0.0–2.0)
Bicarbonate: 24.1 mmol/L (ref 20.0–28.0)
Calcium, Ion: 1.07 mmol/L — ABNORMAL LOW (ref 1.15–1.40)
HCT: 36 % (ref 36.0–46.0)
Hemoglobin: 12.2 g/dL (ref 12.0–15.0)
O2 Saturation: 66 %
Patient temperature: 36
Potassium: 2.6 mmol/L — CL (ref 3.5–5.1)
Sodium: 142 mmol/L (ref 135–145)
TCO2: 25 mmol/L (ref 22–32)
pCO2, Ven: 40 mmHg — ABNORMAL LOW (ref 44–60)
pH, Ven: 7.384 (ref 7.25–7.43)
pO2, Ven: 33 mmHg (ref 32–45)

## 2024-01-10 LAB — BASIC METABOLIC PANEL WITH GFR
Anion gap: 12 (ref 5–15)
Anion gap: 10 (ref 5–15)
BUN: 12 mg/dL (ref 8–23)
BUN: 11 mg/dL (ref 8–23)
CO2: 22 mmol/L (ref 22–32)
CO2: 24 mmol/L (ref 22–32)
Calcium: 8.2 mg/dL — ABNORMAL LOW (ref 8.9–10.3)
Calcium: 7.7 mg/dL — ABNORMAL LOW (ref 8.9–10.3)
Chloride: 106 mmol/L (ref 98–111)
Chloride: 107 mmol/L (ref 98–111)
Creatinine, Ser: 0.61 mg/dL (ref 0.44–1.00)
Creatinine, Ser: 0.57 mg/dL (ref 0.44–1.00)
GFR, Estimated: >60 mL/min
GFR, Estimated: >60 mL/min
Glucose, Bld: 83 mg/dL (ref 70–99)
Glucose, Bld: 105 mg/dL — ABNORMAL HIGH (ref 70–99)
Potassium: 2.7 mmol/L — CL (ref 3.5–5.1)
Potassium: 2.8 mmol/L — ABNORMAL LOW (ref 3.5–5.1)
Sodium: 140 mmol/L (ref 135–145)
Sodium: 141 mmol/L (ref 135–145)

## 2024-01-10 LAB — CBC
HCT: 43 % (ref 36.0–46.0)
Hemoglobin: 14.8 g/dL (ref 12.0–15.0)
MCH: 33.4 pg (ref 26.0–34.0)
MCHC: 34.4 g/dL (ref 30.0–36.0)
MCV: 97.1 fL (ref 80.0–100.0)
Platelets: 259 K/uL (ref 150–400)
RBC: 4.43 MIL/uL (ref 3.87–5.11)
RDW: 14.6 % (ref 11.5–15.5)
WBC: 4.8 K/uL (ref 4.0–10.5)
nRBC: 0 % (ref 0.0–0.2)

## 2024-01-10 SURGERY — MULTIPLE EXTRACTION WITH ALVEOLOPLASTY
Anesthesia: General

## 2024-01-10 MED ORDER — SUGAMMADEX SODIUM 200 MG/2ML IV SOLN
INTRAVENOUS | Status: DC | PRN
  Administered 2024-01-10: 200 mg via INTRAVENOUS

## 2024-01-10 MED ORDER — CHLORHEXIDINE GLUCONATE 0.12 % MT SOLN
15.0000 mL | Freq: Once | OROMUCOSAL | Status: AC
  Administered 2024-01-10: 15 mL via OROMUCOSAL
  Filled 2024-01-10: qty 15

## 2024-01-10 MED ORDER — FENTANYL CITRATE (PF) 100 MCG/2ML IJ SOLN
INTRAMUSCULAR | Status: AC
  Filled 2024-01-10: qty 2

## 2024-01-10 MED ORDER — ONDANSETRON HCL 4 MG/2ML IJ SOLN
INTRAMUSCULAR | Status: DC | PRN
  Administered 2024-01-10: 4 mg via INTRAVENOUS

## 2024-01-10 MED ORDER — DEXAMETHASONE SOD PHOSPHATE PF 10 MG/ML IJ SOLN
INTRAMUSCULAR | Status: DC | PRN
  Administered 2024-01-10: 10 mg via INTRAVENOUS

## 2024-01-10 MED ORDER — PROPOFOL 500 MG/50ML IV EMUL
INTRAVENOUS | Status: DC | PRN
  Administered 2024-01-10: 185 ug/kg/min via INTRAVENOUS
  Administered 2024-01-10: 200 ug/kg/min via INTRAVENOUS
  Administered 2024-01-10: 150 ug/kg/min via INTRAVENOUS

## 2024-01-10 MED ORDER — ONDANSETRON HCL 4 MG/2ML IJ SOLN: 4.0000 mg | Freq: Once | INTRAMUSCULAR | Status: DC | PRN

## 2024-01-10 MED ORDER — EPHEDRINE SULFATE (PRESSORS) 25 MG/5ML IV SOSY
PREFILLED_SYRINGE | INTRAVENOUS | Status: DC | PRN
  Administered 2024-01-10: 5 mg via INTRAVENOUS

## 2024-01-10 MED ORDER — POTASSIUM CHLORIDE CRYS ER 20 MEQ PO TBCR
40.0000 meq | EXTENDED_RELEASE_TABLET | Freq: Once | ORAL | Status: AC
  Administered 2024-01-10: 40 meq via ORAL
  Filled 2024-01-10: qty 2

## 2024-01-10 MED ORDER — LIDOCAINE-EPINEPHRINE 2 %-1:100000 IJ SOLN
INTRAMUSCULAR | Status: DC | PRN
  Administered 2024-01-10: 18 mL via INTRADERMAL

## 2024-01-10 MED ORDER — FENTANYL CITRATE (PF) 100 MCG/2ML IJ SOLN: 25.0000 ug | INTRAMUSCULAR | Status: DC | PRN

## 2024-01-10 MED ORDER — OXYCODONE HCL 5 MG/5ML PO SOLN: 5.0000 mg | Freq: Once | ORAL | Status: AC | PRN

## 2024-01-10 MED ORDER — OXYCODONE HCL 5 MG PO TABS
ORAL_TABLET | ORAL | Status: AC
  Filled 2024-01-10: qty 1

## 2024-01-10 MED ORDER — FENTANYL CITRATE (PF) 250 MCG/5ML IJ SOLN
INTRAMUSCULAR | Status: DC | PRN
  Administered 2024-01-10: 25 ug via INTRAVENOUS
  Administered 2024-01-10: 75 ug via INTRAVENOUS

## 2024-01-10 MED ORDER — ROCURONIUM BROMIDE 10 MG/ML (PF) SYRINGE
PREFILLED_SYRINGE | INTRAVENOUS | Status: DC | PRN
  Administered 2024-01-10: 50 mg via INTRAVENOUS

## 2024-01-10 MED ORDER — POTASSIUM CHLORIDE 10 MEQ/100ML IV SOLN
10.0000 meq | INTRAVENOUS | Status: AC
  Administered 2024-01-10: 10 meq via INTRAVENOUS

## 2024-01-10 MED ORDER — LIDOCAINE HCL (CARDIAC) PF 100 MG/5ML IV SOSY
PREFILLED_SYRINGE | INTRAVENOUS | Status: DC | PRN
  Administered 2024-01-10: 100 mg via INTRATRACHEAL

## 2024-01-10 MED ORDER — LACTATED RINGERS IV SOLN: INTRAVENOUS | Status: DC

## 2024-01-10 MED ORDER — OXYMETAZOLINE HCL 0.05 % NA SOLN
NASAL | Status: DC | PRN
  Administered 2024-01-10: 1 via NASAL

## 2024-01-10 MED ORDER — MIDAZOLAM HCL 2 MG/2ML IJ SOLN
INTRAMUSCULAR | Status: AC
  Filled 2024-01-10: qty 2

## 2024-01-10 MED ORDER — OXYCODONE-ACETAMINOPHEN 5-325 MG PO TABS: 1.0000 | ORAL_TABLET | ORAL | 0 refills | Status: AC | PRN

## 2024-01-10 MED ORDER — ACETAMINOPHEN 10 MG/ML IV SOLN: 1000.0000 mg | Freq: Once | INTRAVENOUS | Status: DC | PRN

## 2024-01-10 MED ORDER — POTASSIUM CHLORIDE 10 MEQ/100ML IV SOLN
INTRAVENOUS | Status: AC
  Filled 2024-01-10: qty 200

## 2024-01-10 MED ORDER — PROPOFOL 10 MG/ML IV BOLUS
INTRAVENOUS | Status: DC | PRN
  Administered 2024-01-10: 70 mg via INTRAVENOUS

## 2024-01-10 MED ORDER — CLINDAMYCIN PHOSPHATE 600 MG/50ML IV SOLN
600.0000 mg | INTRAVENOUS | Status: AC
  Administered 2024-01-10: 600 mg via INTRAVENOUS
  Filled 2024-01-10: qty 50

## 2024-01-10 MED ORDER — ORAL CARE MOUTH RINSE: 15.0000 mL | Freq: Once | OROMUCOSAL | Status: AC

## 2024-01-10 MED ORDER — MIDAZOLAM HCL (PF) 2 MG/2ML IJ SOLN
INTRAMUSCULAR | Status: DC | PRN
  Administered 2024-01-10: 2 mg via INTRAVENOUS

## 2024-01-10 MED ORDER — CLINDAMYCIN HCL 300 MG PO CAPS: 300.0000 mg | ORAL_CAPSULE | Freq: Three times a day (TID) | ORAL | 0 refills | Status: AC

## 2024-01-10 MED ORDER — POTASSIUM CHLORIDE 10 MEQ/100ML IV SOLN
INTRAVENOUS | Status: DC | PRN
  Administered 2024-01-10: 10 meq via INTRAVENOUS

## 2024-01-10 MED ORDER — OXYCODONE HCL 5 MG PO TABS
5.0000 mg | ORAL_TABLET | Freq: Once | ORAL | Status: AC | PRN
  Administered 2024-01-10: 5 mg via ORAL

## 2024-01-10 SURGICAL SUPPLY — 26 items
BAG COUNTER SPONGE SURGICOUNT (BAG) IMPLANT
BLADE SURG 15 STRL LF DISP TIS (BLADE) ×2 IMPLANT
BUR CROSS CUT FISSURE 1.6 (BURR) ×2 IMPLANT
BUR EGG ELITE 4.0 (BURR) IMPLANT
BUR SRG MED 1.2XXCUT FSSR (BURR) IMPLANT
CANISTER SUCTION 3000ML PPV (SUCTIONS) ×2 IMPLANT
COVER SURGICAL LIGHT HANDLE (MISCELLANEOUS) ×2 IMPLANT
GAUZE PACKING FOLDED 2 STR (GAUZE/BANDAGES/DRESSINGS) ×2 IMPLANT
GLOVE BIO SURGEON STRL SZ8 (GLOVE) ×2 IMPLANT
GOWN STRL REUS W/ TWL LRG LVL3 (GOWN DISPOSABLE) ×2 IMPLANT
GOWN STRL REUS W/ TWL XL LVL3 (GOWN DISPOSABLE) ×2 IMPLANT
IV 0.9% NACL 1000 ML (IV SOLUTION) ×2 IMPLANT
KIT BASIN OR (CUSTOM PROCEDURE TRAY) ×2 IMPLANT
KIT TURNOVER KIT B (KITS) ×2 IMPLANT
NEEDLE HYPO 25GX1X1/2 BEV (NEEDLE) ×4 IMPLANT
PAD ARMBOARD POSITIONER FOAM (MISCELLANEOUS) ×2 IMPLANT
SLEEVE IRRIGATION ELITE 7 (MISCELLANEOUS) ×2 IMPLANT
SOLN 0.9% NACL POUR BTL 1000ML (IV SOLUTION) ×2 IMPLANT
SPIKE FLUID TRANSFER (MISCELLANEOUS) IMPLANT
SUT CHROMIC 3 0 SH 27 (SUTURE) IMPLANT
SUT CHROMIC 4 0 RB 1X27 (SUTURE) ×2 IMPLANT
SYR BULB IRRIG 60ML STRL (SYRINGE) ×2 IMPLANT
SYR CONTROL 10ML LL (SYRINGE) ×2 IMPLANT
TRAY ENT MC OR (CUSTOM PROCEDURE TRAY) ×2 IMPLANT
TUBING IRRIGATION (MISCELLANEOUS) ×2 IMPLANT
YANKAUER SUCT BULB TIP NO VENT (SUCTIONS) ×2 IMPLANT

## 2024-01-10 NOTE — Anesthesia Procedure Notes (Signed)
 Procedure Name: Intubation Date/Time: 01/10/2024 9:02 AM  Performed by: Mollie Olivia SAUNDERS, CRNAPre-anesthesia Checklist: Patient identified, Emergency Drugs available, Suction available and Patient being monitored Patient Re-evaluated:Patient Re-evaluated prior to induction Oxygen Delivery Method: Circle system utilized Preoxygenation: Pre-oxygenation with 100% oxygen Induction Type: IV induction Ventilation: Mask ventilation without difficulty Laryngoscope Size: Mac and 3 Grade View: Grade II Nasal Tubes: Nasal prep performed, Nasal Rae and Magill forceps - small, utilized Endobronchial tube: Left Tube size: 6.5 mm Number of attempts: 1 Placement Confirmation: ETT inserted through vocal cords under direct vision, positive ETCO2 and breath sounds checked- equal and bilateral Secured at: 26 cm Tube secured with: Tape Dental Injury: Teeth and Oropharynx as per pre-operative assessment  Difficulty Due To: Difficulty was anticipated, Difficult Airway- due to anterior larynx and Difficult Airway- due to dentition

## 2024-01-10 NOTE — H&P (Signed)
 H&P documentation  -History and Physical Reviewed  -Patient has been re-examined  -No change in the plan of care  Gabriella Hodges

## 2024-01-10 NOTE — Op Note (Deleted)
   The note originally documented on this encounter has been moved the the encounter in which it belongs.

## 2024-01-10 NOTE — Op Note (Signed)
 01/10/2024  9:40 AM  PATIENT:  Gabriella Hodges  64 y.o. female  PRE-OPERATIVE DIAGNOSIS:  NON-RESTORABLE TEETH #'S 2, 7, 8, 9, 10, 11, 15, 16, 17, 31, 32 SECONDARY TO DENTAL CARIES   POST-OPERATIVE DIAGNOSIS:  SAME  PROCEDURE:  Procedures: MULTIPLE EXTRACTION  TEETH #'S 2, 7, 8, 9, 10, 11, 15, 16, 17, 31, 32   SURGEON:  Surgeon(s): Sheryle Hamilton, DMD  ANESTHESIA:   local and general  EBL:  minimal  DRAINS: none   SPECIMEN:  No Specimen  COUNTS:  YES  PLAN OF CARE: Discharge to home after PACU  PATIENT DISPOSITION:  PACU - hemodynamically stable.   PROCEDURE DETAILS: Dictation #[id number]  [name], DMD 01/10/2024 9:40 AM

## 2024-01-10 NOTE — Op Note (Signed)
 NAME: Gabriella Hodges, Gabriella Hodges MEDICAL RECORD NO: [id number] ACCOUNT NO: [id number] DATE OF BIRTH: 1960-03-07 FACILITY: [facility] LOCATION: [facility] PHYSICIAN: Glendia EMERSON Primrose, DDS  Operative Report   DATE OF PROCEDURE: 01/10/2024  PREOPERATIVE DIAGNOSES:  Nonrestorable teeth numbers 2, 7, 8, 9, 10, 11, 15, 16, 17, 31, 32 secondary to dental caries.  POSTOPERATIVE DIAGNOSES:  Nonrestorable teeth numbers 2, 7, 8, 9, 10, 11, 15, 16, 17, 31, 32 secondary to dental caries.  PROCEDURE:  Extraction of teeth numbers 2, 7, 8, 9, 10, 11, 15, 16, 17, 31, 32.  SURGEON:  Glendia EMERSON Primrose, DDS  ANESTHESIA:  General nasal intubation, Dr. Keneth attending.  DESCRIPTION OF PROCEDURE:  The patient was taken to the OR, placed on the table in supine position.  A nasal endotracheal tube was placed and secured.  The eyes were protected.  The patient was draped for surgery.  Timeout was performed.  The posterior  pharynx was suctioned and a throat pack was placed.  2% lidocaine  1:100,000 epinephrine  was infiltrated in an inferior alveolar block on the right and left sides and in buccal and palatal infiltration around the maxillary teeth to be removed.  Total of  18 mL was utilized.  The left side was operated first.  A 15 blade was used to make an incision buccally and lingually around tooth number 17, which was slightly visible as residual roots.  The periosteum was reflected.  The Stryker handpiece was used to remove bone in the  sulcus and then the 301 elevator was used to elevate the root and remove it.  The socket was curetted, irrigated, and closed with 3-0 chromic.  Then the 15 blade was used to make an incision around teeth numbers 15 and 16 in the gingival sulcus.  Bone  was removed interproximally with the Stryker handpiece and then the teeth were elevated and removed with the dental forceps and 301 elevator.  The sockets were curetted, irrigated, and closed with 3-0 chromic.  Then, attention was turned to the  anterior  teeth.  A 15 blade was used to make an incision buccally and palatally in the gingival sulcus from teeth number 7, 8, 9, 10, and 11.  The periosteum was reflected, the teeth were elevated and removed with the dental forceps.  The sockets were curetted,  tissue was trimmed, sockets were irrigated, and then closed with 3-0 chromic.  Attention was then turned to the right side of the mouth.  A 15 blade was used to make an incision around teeth numbers 31 and 32 and around tooth number 2.  The periosteum was reflected, bone was removed around teeth numbers 31 and 32, and then the  teeth were elevated and removed with the 301 elevator and forceps.  The sockets were curetted, irrigated, and closed with the 4-0 chromic.  The 15 blade was used to make an incision around tooth number 2 in the sulcus.  The periosteum was reflected.  The  tooth was elevated with the 301 elevator and removed with the dental forceps.  The socket was then irrigated and closed with 4-0 chromic.  Then the oral cavity was irrigated and suctioned.  The throat pack was removed.  The patient was left in care of  anesthesia for discharge through day surgery.  ESTIMATED BLOOD LOSS:  Minimal.  COMPLICATIONS:  None.  SPECIMENS:  None.  COUNTS:  Correct.   VAI D: 01/10/2024 9:44:30 am T: 01/10/2024 9:51:00 am  JOB: [id number]/ [id number]

## 2024-01-10 NOTE — Anesthesia Postprocedure Evaluation (Addendum)
"   Anesthesia Post Note  Patient: Gabriella Hodges  Procedure(s) Performed: MULTIPLE EXTRACTION WITH ALVEOLOPLASTY     Patient location during evaluation: PACU Anesthesia Type: General Level of consciousness: awake and alert Pain management: pain level controlled Vital Signs Assessment: post-procedure vital signs reviewed and stable Respiratory status: spontaneous breathing, nonlabored ventilation, respiratory function stable and patient connected to nasal cannula oxygen Cardiovascular status: blood pressure returned to baseline and stable Postop Assessment: no apparent nausea or vomiting Anesthetic complications: no Comments: Hypokalemia persists despite intra-op and postop IV repletion; PO potassium administered. This has been a longstanding issue for her. Discussed with patient importance of close followup for repeat labs after discharge to ensure resolution; she reports difficulty getting in with her PCP on short notice. Message sent to cardiology NP who recently titrated antihypertensive regimen to see about short-term followup.    No notable events documented.  Last Vitals:  Vitals:   01/10/24 1315 01/10/24 1330  BP: (!) 146/74 (!) 147/73  Pulse: 67 68  Resp: 11 12  Temp:  36.6 C  SpO2: 96% 95%    Last Pain:  Vitals:   01/10/24 0954  TempSrc:   PainSc: Asleep                 Lynwood MARLA Cornea      "

## 2024-01-10 NOTE — Transfer of Care (Signed)
 Immediate Anesthesia Transfer of Care Note  Patient: Gabriella Hodges  Procedure(s) Performed: MULTIPLE EXTRACTION WITH ALVEOLOPLASTY  Patient Location: PACU  Anesthesia Type:General  Level of Consciousness: awake, alert , and oriented  Airway & Oxygen Therapy: Patient Spontanous Breathing and Patient connected to nasal cannula oxygen  Post-op Assessment: Report given to RN and Post -op Vital signs reviewed and stable  Post vital signs: Reviewed and stable  Last Vitals:  Vitals Value Taken Time  BP 154/81 01/10/24 10:30  Temp 36.5 C 01/10/24 09:54  Pulse 71 01/10/24 10:31  Resp 10 01/10/24 10:31  SpO2 96 % 01/10/24 10:31  Vitals shown include unfiled device data.  Last Pain:  Vitals:   01/10/24 0954  TempSrc:   PainSc: Asleep         Complications: No notable events documented.

## 2024-01-10 NOTE — Progress Notes (Signed)
 CRITICAL RESULT PROVIDER NOTIFICATION  Test performed and critical result:  K 2.7   Date and time result received:  01/10/24 0842   Provider name/title: Dr. Sheryle and Dr. Keneth  Date and time provider notified: 01/10/24 0843  Date and time provider responded: 01/10/24 0843  Provider response:In department Dr. Keneth aware and placing orders

## 2024-01-11 ENCOUNTER — Encounter (HOSPITAL_COMMUNITY): Payer: Self-pay | Admitting: Oral Surgery

## 2024-01-11 NOTE — Addendum Note (Signed)
 Addendum  created 01/11/24 1824 by Keneth Lynwood POUR, MD   Clinical Note Signed

## 2024-01-13 ENCOUNTER — Telehealth: Payer: Self-pay | Admitting: *Deleted

## 2024-01-13 NOTE — Telephone Encounter (Signed)
Attempted to reach patient - voice mail not set up.  

## 2024-01-13 NOTE — Telephone Encounter (Signed)
 Per Almarie Crate, NP - I had her anesthesiologist notify me last Friday about low K+ levels. Can we have her repeat a STAT BMET either today or tomorrow?

## 2024-01-16 ENCOUNTER — Encounter (HOSPITAL_COMMUNITY): Payer: Self-pay

## 2024-01-16 ENCOUNTER — Emergency Department (HOSPITAL_COMMUNITY): Admission: EM | Admit: 2024-01-16 | Discharge: 2024-01-17 | Disposition: A | Discharge status: Home / Self Care

## 2024-01-16 ENCOUNTER — Other Ambulatory Visit: Payer: Self-pay

## 2024-01-16 DIAGNOSIS — R1031 Right lower quadrant pain: Secondary | ICD-10-CM | POA: Diagnosis not present

## 2024-01-16 DIAGNOSIS — E876 Hypokalemia: Secondary | ICD-10-CM | POA: Insufficient documentation

## 2024-01-16 DIAGNOSIS — R112 Nausea with vomiting, unspecified: Secondary | ICD-10-CM | POA: Insufficient documentation

## 2024-01-16 DIAGNOSIS — R109 Unspecified abdominal pain: Secondary | ICD-10-CM

## 2024-01-16 DIAGNOSIS — R197 Diarrhea, unspecified: Secondary | ICD-10-CM | POA: Diagnosis not present

## 2024-01-16 DIAGNOSIS — R1011 Right upper quadrant pain: Secondary | ICD-10-CM | POA: Insufficient documentation

## 2024-01-16 LAB — COMPREHENSIVE METABOLIC PANEL WITH GFR
ALT: 8 U/L (ref 0–44)
AST: 12 U/L — ABNORMAL LOW (ref 15–41)
Albumin: 3.4 g/dL — ABNORMAL LOW (ref 3.5–5.0)
Alkaline Phosphatase: 124 U/L (ref 38–126)
Anion gap: 6 (ref 5–15)
BUN: 6 mg/dL — ABNORMAL LOW (ref 8–23)
CO2: 33 mmol/L — ABNORMAL HIGH (ref 22–32)
Calcium: 8.3 mg/dL — ABNORMAL LOW (ref 8.9–10.3)
Chloride: 102 mmol/L (ref 98–111)
Creatinine, Ser: 0.57 mg/dL (ref 0.44–1.00)
GFR, Estimated: >60 mL/min
Glucose, Bld: 96 mg/dL (ref 70–99)
Potassium: 2.8 mmol/L — ABNORMAL LOW (ref 3.5–5.1)
Sodium: 141 mmol/L (ref 135–145)
Total Bilirubin: 0.4 mg/dL (ref 0.0–1.2)
Total Protein: 5.6 g/dL — ABNORMAL LOW (ref 6.5–8.1)

## 2024-01-16 LAB — CBC
HCT: 40.3 % (ref 36.0–46.0)
Hemoglobin: 13.7 g/dL (ref 12.0–15.0)
MCH: 33.1 pg (ref 26.0–34.0)
MCHC: 34 g/dL (ref 30.0–36.0)
MCV: 97.3 fL (ref 80.0–100.0)
Platelets: 210 K/uL (ref 150–400)
RBC: 4.14 MIL/uL (ref 3.87–5.11)
RDW: 13.6 % (ref 11.5–15.5)
WBC: 9.2 K/uL (ref 4.0–10.5)
nRBC: 0 % (ref 0.0–0.2)

## 2024-01-16 LAB — LIPASE, BLOOD: Lipase: <10 U/L — ABNORMAL LOW (ref 11–51)

## 2024-01-16 NOTE — Telephone Encounter (Signed)
 Notified patient - states she has been sick & still has vomiting.  Advised her to go to [facility] ED for evaluation as her potassium on 01/10/24 was 2.8.  Has not had rechecked & is not on any replacement.  Has noticed some palpitations, weakness & generally not feeling well.

## 2024-01-16 NOTE — ED Triage Notes (Signed)
 Pt reports nausea/vomiting since Friday. Pt states that she had a bunch of teeth cut out on Friday.

## 2024-01-17 ENCOUNTER — Emergency Department (HOSPITAL_COMMUNITY)

## 2024-01-17 MED ORDER — ONDANSETRON HCL 4 MG PO TABS: 4.0000 mg | ORAL_TABLET | Freq: Three times a day (TID) | ORAL | 0 refills | Status: AC | PRN

## 2024-01-17 MED ORDER — POTASSIUM CHLORIDE CRYS ER 20 MEQ PO TBCR: 40.0000 meq | EXTENDED_RELEASE_TABLET | Freq: Every day | ORAL | 0 refills | Status: DC

## 2024-01-17 MED ORDER — FENTANYL CITRATE (PF) 100 MCG/2ML IJ SOLN
50.0000 ug | Freq: Once | INTRAMUSCULAR | Status: AC
  Administered 2024-01-17: 50 ug via INTRAVENOUS
  Filled 2024-01-17: qty 2

## 2024-01-17 MED ORDER — POTASSIUM CHLORIDE 20 MEQ PO PACK
40.0000 meq | PACK | Freq: Once | ORAL | Status: AC
  Administered 2024-01-17: 40 meq via ORAL
  Filled 2024-01-17: qty 2

## 2024-01-17 MED ORDER — IOHEXOL 300 MG/ML  SOLN
100.0000 mL | Freq: Once | INTRAMUSCULAR | Status: AC | PRN
  Administered 2024-01-17: 100 mL via INTRAVENOUS

## 2024-01-17 MED ORDER — LACTATED RINGERS IV BOLUS
1000.0000 mL | Freq: Once | INTRAVENOUS | Status: AC
  Administered 2024-01-17: 1000 mL via INTRAVENOUS

## 2024-01-17 MED ORDER — ONDANSETRON HCL 4 MG/2ML IJ SOLN
4.0000 mg | Freq: Once | INTRAMUSCULAR | Status: AC
  Administered 2024-01-17: 4 mg via INTRAVENOUS
  Filled 2024-01-17: qty 2

## 2024-01-17 NOTE — ED Notes (Signed)
 Patient transported to CT

## 2024-01-17 NOTE — ED Provider Notes (Signed)
 " [facility] AT [facility] Provider Note   CSN: [id number] Arrival date & time: 01/16/24  2011     Patient presents with: Post-op Problem and Emesis   Gabriella Hodges is a 64 y.o. female.  {Add pertinent medical, surgical, social history, OB history to HPI:8481} 64 year old female presents for evaluation of abdominal pain on the right upper and lower quadrant that started on Friday.  States has been associated with nausea and vomiting and some diarrhea as well.  States she has been unable to keep anything down.  Denies any other symptoms or concerns.   Emesis Associated symptoms: abdominal pain and diarrhea   Associated symptoms: no arthralgias, no chills, no cough, no fever and no sore throat        Prior to Admission medications  Medication Sig Start Date End Date Taking? Authorizing Provider  budesonide -formoterol  (SYMBICORT ) 80-4.5 MCG/ACT inhaler Take 2 puffs first thing in am and then another 2 puffs about 12 hours later. Patient not taking: Reported on 12/24/2023 12/24/23   Darlean Ozell NOVAK, MD  clindamycin  (CLEOCIN ) 300 MG capsule Take 1 capsule (300 mg total) by mouth 3 (three) times daily. 01/10/24   Sheryle Hamilton, DMD  docusate sodium  (COLACE) 100 MG capsule Take 100 mg by mouth daily as needed for mild constipation or moderate constipation.    [provider]  famotidine  (PEPCID ) 20 MG tablet One after supper 03/18/23   Darlean Ozell NOVAK, MD  fluconazole  (DIFLUCAN ) 200 MG tablet Take 2 tablets on day 1 then 1 tablet daily thereafter for a total of 14 days. 12/26/23   Cindie Carlin POUR, DO  fluticasone  (FLONASE ) 50 MCG/ACT nasal spray Place 2 sprays into both nostrils 2 (two) times daily. 05/22/23   Soldatova, Liuba, MD  ibuprofen (ADVIL) 200 MG tablet Take 400 mg by mouth daily as needed for moderate pain (pain score 4-6).    [provider]  levocetirizine (XYZAL  ALLERGY 24HR) 5 MG tablet Take 1 tablet (5 mg total) by mouth  every evening. Patient not taking: Reported on 12/24/2023 05/22/23   Soldatova, Liuba, MD  levothyroxine  (SYNTHROID ) 175 MCG tablet Take 175 mcg by mouth daily. 12/02/23   [provider]  losartan  (COZAAR ) 25 MG tablet Take 1 tablet (25 mg total) by mouth daily. 11/14/23 02/12/24  Miriam Norris, NP  pantoprazole  (PROTONIX ) 40 MG tablet Take 1 tablet (40 mg total) by mouth 2 (two) times daily before a meal. 30 minutes before meals 11/21/23   Shirlean Therisa ORN, NP  PHENobarbital  (LUMINAL) 97.2 MG tablet Take 97.2 mg by mouth at bedtime.      [provider]  VENTOLIN  HFA 108 (90 Base) MCG/ACT inhaler Inhale 2 puffs into the lungs every 4 (four) hours as needed. 11/07/22   [provider]    Allergies: Shellfish protein-containing drug products, Nsaids, Penicillins, and Sulfa antibiotics    Review of Systems  Constitutional:  Negative for chills and fever.  HENT:  Negative for ear pain and sore throat.   Eyes:  Negative for pain and visual disturbance.  Respiratory:  Negative for cough and shortness of breath.   Cardiovascular:  Negative for chest pain and palpitations.  Gastrointestinal:  Positive for abdominal pain, diarrhea, nausea and vomiting.  Genitourinary:  Negative for dysuria and hematuria.  Musculoskeletal:  Negative for arthralgias and back pain.  Skin:  Negative for color change and rash.  Neurological:  Negative for seizures and syncope.  All other systems reviewed and are negative.  Updated Vital Signs BP (!) 167/88 (BP Location: Right Arm)   Pulse 61   Temp 97.9 F (36.6 C) (Oral)   Resp 16   Ht 5' 3 (1.6 m)   Wt 65.3 kg   SpO2 99%   BMI 25.51 kg/m   Physical Exam Vitals and nursing note reviewed.  Constitutional:      General: She is not in acute distress.    Appearance: Normal appearance. She is well-developed. She is ill-appearing.  HENT:     Head: Normocephalic and atraumatic.  Eyes:     Conjunctiva/sclera: Conjunctivae normal.   Cardiovascular:     Rate and Rhythm: Normal rate and regular rhythm.     Heart sounds: No murmur heard. Pulmonary:     Effort: Pulmonary effort is normal. No respiratory distress.     Breath sounds: Normal breath sounds.  Abdominal:     Palpations: Abdomen is soft.     Tenderness: There is abdominal tenderness.  Musculoskeletal:        General: No swelling.     Cervical back: Neck supple.  Skin:    General: Skin is warm and dry.     Capillary Refill: Capillary refill takes less than 2 seconds.  Neurological:     General: No focal deficit present.     Mental Status: She is alert.  Psychiatric:        Mood and Affect: Mood normal.     (all labs ordered are listed, but only abnormal results are displayed) Labs Reviewed  LIPASE, BLOOD - Abnormal; Notable for the following components:      Result Value   Lipase <10 (*)    All other components within normal limits  COMPREHENSIVE METABOLIC PANEL WITH GFR - Abnormal; Notable for the following components:   Potassium 2.8 (*)    CO2 33 (*)    BUN 6 (*)    Calcium 8.3 (*)    Total Protein 5.6 (*)    Albumin 3.4 (*)    AST 12 (*)    All other components within normal limits  CBC  URINALYSIS, ROUTINE W REFLEX MICROSCOPIC    EKG: None  Radiology: No results found.  {Document cardiac monitor, telemetry assessment procedure when appropriate:32947} Procedures   Medications Ordered in the ED  ondansetron  (ZOFRAN ) injection 4 mg (has no administration in time range)  fentaNYL  (SUBLIMAZE ) injection 50 mcg (has no administration in time range)  lactated ringers  bolus 1,000 mL (has no administration in time range)  potassium chloride  (KLOR-CON ) packet 40 mEq (has no administration in time range)      {Click here for ABCD2, HEART and other calculators REFRESH Note before signing:1}                              Medical Decision Making Amount and/or Complexity of Data Reviewed Labs: ordered. Radiology:  ordered.  Risk Prescription drug management.   ***  {Document critical care time when appropriate  Document review of labs and clinical decision tools ie CHADS2VASC2, etc  Document your independent review of radiology images and any outside records  Document your discussion with family members, caretakers and with consultants  Document social determinants of health affecting pt's care  Document your decision making why or why not admission, treatments were needed:32947:::1}   Final diagnoses:  None    ED Discharge Orders     None        "

## 2024-01-17 NOTE — Discharge Instructions (Signed)
 Drink lots of fluids and eat a bland diet for the next 24 hours.  You can use Tylenol  Motrin as needed for pain.  You should take your potassium daily as prescribed until you run out.  You can use your Zofran  as needed for nausea and vomiting.

## 2024-01-21 ENCOUNTER — Ambulatory Visit: Payer: Self-pay | Admitting: Family Medicine

## 2024-02-04 ENCOUNTER — Encounter: Payer: Self-pay | Admitting: Internal Medicine

## 2024-02-04 ENCOUNTER — Ambulatory Visit: Admitting: Internal Medicine

## 2024-02-04 VITALS — BP 138/68 | HR 55 | Ht 63.0 in | Wt 104.0 lb

## 2024-02-04 DIAGNOSIS — J449 Chronic obstructive pulmonary disease, unspecified: Secondary | ICD-10-CM | POA: Diagnosis not present

## 2024-02-04 DIAGNOSIS — E876 Hypokalemia: Secondary | ICD-10-CM | POA: Insufficient documentation

## 2024-02-04 DIAGNOSIS — R058 Other specified cough: Secondary | ICD-10-CM | POA: Diagnosis not present

## 2024-02-04 NOTE — Assessment & Plan Note (Addendum)
 Onset p thyroid surgery in the 1990s (Dr Mavis)  - refer to [facility] 03/18/2023 >>> ENT eval 05/22/23 neg rec  rx for pnds/gerd  - Allergy screen 12/24/2023 >  Eos 0.2/  IgE  46 - MBS 02/04/2024 >>>

## 2024-02-04 NOTE — Assessment & Plan Note (Signed)
 Active smoker/ Alpha one MS phenotyp  level = 127 but no airflow obstruction 12/24/2023 despite mod emphysema on CT 12/30/23 so GOLD 0 -  11/20/2022  After extensive coaching inhaler device,  effectiveness =    60% (short ti) > try breztri  and approp saba  -  12/18/22 LDSCT   Centrilobular emphysema  - 03/18/2023  After extensive coaching inhaler device,  effectiveness =    60% hfa > continue breztri  - 09/23/2023  After extensive coaching inhaler device,  effectiveness =    near 0% at baseline  maybe 50% with coaching - 09/23/2023   Walked on RA  x  2  lap(s) =  approx 300  ft  @  mod  pace, stopped due to tired with lowest 02 sats 97%   and min sob  - PFT's  12/24/2023   FEV1 1.73 (73 % ) ratio 0.83  p 0 % improvement from saba p 0 prior to study with DLCO  12.65 (66%)   and FV curve nl   LDSCT  12/19/23 mod emphysema   - 12/24/2023   Walked on RA  x  2  lap(s) =  approx 300  ft  @  mod pace, stopped due to back pain  with lowest 02 sats 96%   >>> continue symbicort  80 2bid with refills per PCP or back here yearly

## 2024-02-04 NOTE — Assessment & Plan Note (Addendum)
-   assoc with alkalosis typical of vomiting >>> repeat bmet  02/04/2024   >>> MBS to see why she can' t swallow s gag/ vomit which is the likely cause of her low K and alkalosis.      Each maintenance medication was reviewed in detail including emphasizing most importantly the difference between maintenance and prns and under what circumstances the prns are to be triggered using an action plan format where appropriate.  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

## 2024-02-04 NOTE — Patient Instructions (Addendum)
 Be sure Pantoprazole  (protonix ) 40 mg   Taken  30-60 min before first meal of the day and Pepcid  (famotidine )  20 mg after supper until return to office - this is the best way to tell whether stomach acid is contributing to your problem.    My office will be contacting you by phone for referral to Modified barium swallow   - if you don't hear back from my office within one week please call us  back or notify us  thru MyChart and we'll address it right away.   Please remember to go to the lab department   for your tests - we will call you with the results when they are available.     Please remember to go to the  x-ray department  @  [facility] for your tests - we will call you with the results when they are available       If you are satisfied with your treatment plan,  let your doctor know and he/she can either refill your medications or you can return here when your prescription runs out.     If in any way you are not 100% satisfied,  please tell us .  If 100% better, tell your friends!  Pulmonary follow up is as needed

## 2024-02-04 NOTE — Progress Notes (Signed)
 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   Dates cough back to thyroid surgery by Gabriella Hodges  UGI   2006 with mod HH with overt GERD   ENT eval pos for GERD only  05/22/23     History of Present Illness  11/20/2022  Pulmonary/ 1st office eval/ Gabriella Hodges / [place] [facility]  Chief Complaint  Patient presents with   Establish Care  Dyspnea:  raking leaves x 30 min  vs sev hours prior to onset  Cough:   thick and sometimes green Sleep: sleeps in chair x 8 y  p thyroid surgery for cancer [facility]  SABA use: avg 2 x daily  02: none  Lung cancer screen: referred 11/20/2022  Rec Plan A = Automatic = Always=    Breztri  Take 2 puffs first thing in am and then another 2 puffs about 12 hours later.  Work on inhaler technique:  Plan B = Backup (to supplement plan A, not to replace it) Only use your albuterol  inhaler as a rescue medication  For cough/ congestion > mucinex or mucinex dm  up to maximum of  1200 mg every 12 hours as needed  Omeprzole 40 mg   Take  30-60 min before first meal of the day and Pepcid  (famotidine )  20 mg after supper until return to office      12/18/22 LDSCT  RADS 2/ Centrilobular emphysema   Admit date: 02/18/2023 Discharge date: 02/19/2023 Recommendations for Outpatient Follow-up:  Follow up with PCP in 1-2 weeks, repeat BMP in 1 week. Continue on Tamiflu  with complete course of treatment Given 1 dose of fosfomycin  for UTI prior to discharge. Continue other home medications as prior   Diet recommendation: Heart Healthy   Brief/Interim Summary: Gabriella Hodges is a 64 y.o. female with medical  history significant of vertigo, seizure disorder, iron  deficiency anemia, COPD, tobacco usage, and hypothyroidism.Patient presented to the ED on 2/8 with complaints of headache, sore throat, productive cough with green sputum for several days.  She has also had dysuria which apparently is a chronic problem but has no history of recurrent UTI.  She was admitted for acute COPD exacerbation secondary to influenza A infection along with concerns for UTI.  She is overall doing much better and did not require any oxygen supplementation during the course of this admission.  Her lung sounds are back to baseline and she continues to have some mild dysuria for which she will be given fosfomycin  prior to discharge.  No other acute events or concerns noted aside from some hypokalemia which has now resolved.  Etiology for this is unclear and she will need to follow-up with PCP to repeat BMP in 1 week.      ENT eval 05/22/23 no specific findings rec  Prescribed Flonase  nasal spray twice daily. - Prescribed Xyzal  at night. - Continue omeprazole . - Recommend Reflux Gourmet supplement for GERD. - Consider allergy testing to exclude allergies as a cause of drainage.     09/23/2023  f/u ov/ office/Gabriella Hodges re:  emphysema on CT/presumed copd   maint on Breztri  prn  smoker  Chief Complaint  Patient presents with   Shortness of Breath    In hosp for shob   Dyspnea:  worse with onset of recurrent cp used to come and go (x 5 years) and now and stayed  x 3 weeks  located epigastrium worse p eating each meal regardless of type of food or time of day  Cough: none  Sleeping: stays upright always /never lies down   x years  SABA use: avg use  02: none  Lung cancer screening: due q dec Patient Instructions  Plan A = Automatic = Always=    Breztri  Take 2 puffs first thing in am and then another 2 puffs about 12 hours later.   Work on inhaler technique:    Plan B = Backup (to supplement plan A, not to replace  it) Use your albuterol  inhaler as a rescue medication Try to lie down when pain bothers you to see if it goes away within about 10 minutes and if so it is Gas  -try GAS X  Treatment consists of avoiding foods that cause gas (especially boiled eggs, mexcican food but especially  beans and undercooked vegetables like  spinach and some salads)   - see DR Quintin when you can  Stop ibuprofen and just use  Tylenol  Arthritis     LDSCT  12/19/23 RADS 2  Moderate centrilobular emphysema.   12/24/2023  f/u ov/[place] office/Gabriella Hodges re: doe and cough x 2023 with mod emphysema on LDSCt  maint on breztri    still  smoking    Chief Complaint  Patient presents with   COPD    Pft @ [address] - coughing    Dyspnea:  [employer] uses [facility]  Cough: mostly nonproductive  Sleeping: sleeps sitting up x years  SABA use: none  02: none  Patient Instructions  Stop Breztri   Start symbicort   80 (breyna  80 or dulera 100 )  up to 2 puff every 12 hours  Please schedule a follow up office visit in 6 weeks, call sooner if needed with all medications /inhalers/ solutions in hand     Allergy screen 12/24/2023 >  Eos 0.2/  IgE  46  Jan 10 2023 11 teeth removed> to ER 01/16/23 with abd pain  N/V >>> neg labs x low K, HC03  33  neg CT abd    02/04/2024 post ER  f/u ov/[place] office/Gabriella Hodges re: MS phenotype COPD 0/ AB  maint on Symbicort  80   / did not bring meds but denies post op pain / narc need/ abd pain resolved  Chief Complaint  Patient presents with   COPD    States she has emphysema - coughing / shob  Potassium is low and think it might have something to do with her symptoms  Dyspnea:  slowed down by back and R knee  Cough: improved but still gag/vomit always related to eating or dringing one Sleeping: upright in chair  x years SABA use: none  02: none   Lung cancer screening: every December   No obvious day to day or daytime variability or assoc excess/ purulent sputum or mucus plugs or  hemoptysis or cp or chest tightness, subjective wheeze or overt sinus or hb symptoms.    Also denies any obvious fluctuation of symptoms with weather or environmental changes or other aggravating or alleviating factors except as outlined above   No unusual exposure hx or h/o  childhood pna/ asthma or knowledge of premature birth.  Current Allergies, Complete Past Medical History, Past Surgical History, Family History, and Social History were reviewed in [employer] record.  ROS  The following are not active complaints unless bolded Hoarseness, sore throat, dysphagia(globus) , dental problems, itching, sneezing,  nasal congestion or discharge of excess mucus or purulent secretions, ear ache,   fever, chills, sweats, unintended wt loss or wt gain, classically pleuritic or exertional cp,  orthopnea pnd or arm/hand swelling  or leg swelling, presyncope, palpitations, abdominal pain, anorexia, nausea, vomiting, diarrhea  or change in bowel habits or change in bladder habits, change in stools or change in urine, dysuria, hematuria,  rash, arthralgias, visual complaints, headache, numbness, weakness or ataxia or problems with walking or coordination,  change in mood or  memory.         Outpatient Medications Prior to Visit  Medication Sig Dispense Refill   budesonide -formoterol  (SYMBICORT ) 80-4.5 MCG/ACT inhaler Take 2 puffs first thing in am and then another 2 puffs about 12 hours later. 1 each 12   docusate sodium  (COLACE) 100 MG capsule Take 100 mg by mouth daily as needed for mild constipation or moderate constipation.     famotidine  (PEPCID ) 20 MG tablet One after supper 30 tablet 11   fluconazole  (DIFLUCAN ) 200 MG tablet Take 2 tablets on day 1 then 1 tablet daily thereafter for a total of 14 days. 15 tablet 0   fluticasone  (FLONASE ) 50 MCG/ACT nasal spray Place 2 sprays into both nostrils 2 (two) times daily. 16 g 6   ibuprofen (ADVIL) 200 MG tablet Take 400 mg by mouth  daily as needed for moderate pain (pain score 4-6).     levothyroxine  (SYNTHROID ) 175 MCG tablet Take 175 mcg by mouth daily.     losartan  (COZAAR ) 25 MG tablet Take 1 tablet (25 mg total) by mouth daily. 90 tablet 3   pantoprazole  (PROTONIX ) 40 MG tablet Take 1 tablet (40 mg total) by mouth 2 (two) times daily before a meal. 30 minutes before meals 180 tablet 3   PHENobarbital  (LUMINAL) 97.2 MG tablet Take 97.2 mg by mouth at bedtime.       VENTOLIN  HFA 108 (90 Base) MCG/ACT inhaler Inhale 2 puffs into the lungs every 4 (four) hours as needed.     clindamycin  (CLEOCIN ) 300 MG capsule Take 1 capsule (300 mg total) by mouth 3 (three) times daily. (Patient not taking: Reported on 02/04/2024) 21 capsule 0   levocetirizine (XYZAL  ALLERGY 24HR) 5 MG tablet Take 1 tablet (5 mg total) by mouth every evening. (Patient not taking: Reported on 02/04/2024) 30 tablet 3   potassium chloride  SA (KLOR-CON  M) 20 MEQ tablet Take 2 tablets (40 mEq total) by mouth daily. (Patient not taking: Reported on 02/04/2024) 6 tablet 0   No facility-administered medications prior to visit.      Past Medical History:  Diagnosis Date   Anxiety    Epilepsy (HCC)    GERD (gastroesophageal reflux disease)    Hypothyroidism    Thyroid disease     Objective:    Wts  02/04/2024        104  12/24/2023      143  09/23/2023        153  06/24/2023        162   03/18/2023        164   02/18/23 165 lb (74.8 kg)  11/26/22 173 lb 11.6 oz (78.8 kg)  11/20/22  175 lb (79.4 kg)    Vital signs reviewed  02/04/2024  - Note at rest 02 sats  99% on RA   General appearance:    somber amb hoarse thin wf nad    HEENT : Oropharynx  clear/ multiple tooth extractions/ no pnds or cobblestoning      Nasal turbinates nl    NECK :  without  apparent JVD/ palpable Nodes/TM    LUNGS: no acc muscle use,  Nl contour chest which is clear to A and P bilaterally without cough on insp or exp maneuvers   CV:  RRR  no s3 or murmur or increase in  P2, and no edema   ABD:  soft and nontender   MS:  Gait nl  ext warm without deformities Or obvious joint restrictions  calf tenderness, cyanosis or clubbing    SKIN: warm and dry without lesions    NEURO:  alert, approp, nl sensorium with  no motor or cerebellar deficits apparent.    CXR PA and Lateral:   02/04/2024 :    I personally reviewed images and impression is as follows:     Did not go for cxr as rec         Assessment   Assessment & Plan COPD GOLD ? / AB Active smoker/ Alpha one MS phenotyp  level = 127 but no airflow obstruction 12/24/2023 despite mod emphysema on CT 12/30/23 so GOLD 0 -  11/20/2022  After extensive coaching inhaler device,  effectiveness =    60% (short ti) > try breztri  and approp saba  -  12/18/22 LDSCT   Centrilobular emphysema  - 03/18/2023  After extensive coaching inhaler device,  effectiveness =    60% hfa > continue breztri  - 09/23/2023  After extensive coaching inhaler device,  effectiveness =    near 0% at baseline  maybe 50% with coaching - 09/23/2023   Walked on RA  x  2  lap(s) =  approx 300  ft  @  mod  pace, stopped due to tired with lowest 02 sats 97%   and min sob  - PFT's  12/24/2023   FEV1 1.73 (73 % ) ratio 0.83  p 0 % improvement from saba p 0 prior to study with DLCO  12.65 (66%)   and FV curve nl   LDSCT  12/19/23 mod emphysema   - 12/24/2023   Walked on RA  x  2  lap(s) =  approx 300  ft  @  mod pace, stopped due to back pain  with lowest 02 sats 96%   >>> continue symbicort  80 2bid with refills per PCP or back here yearly   Upper airway cough syndrome Onset p thyroid surgery in the 1990s (Dr Gabriella Hodges)  - refer to [facility] 03/18/2023 >>> ENT eval 05/22/23 neg rec  rx for pnds/gerd  - Allergy screen 12/24/2023 >  Eos 0.2/  IgE  46 - MBS 02/04/2024 >>>   Hypokalemia due to inadequate potassium intake - assoc with alkalosis typical of vomiting >>> repeat bmet  02/04/2024   >>> MBS to see why she can' t swallow s gag/ vomit which is  the likely cause of her low K and alkalosis.      Each maintenance medication was reviewed in detail including emphasizing most importantly the difference between maintenance and prns and under what circumstances the prns are to be triggered using an action plan format where appropriate.  Total time for H and P, chart  review, counseling, reviewing hfa device(s) and generating customized AVS unique to this office visit / same day charting = 42 min   for post ER f/u patient with multiple chronic  refractory   symptoms of uncertain etiology          AVS  Patient Instructions  Be sure Pantoprazole  (protonix ) 40 mg   Taken  30-60 min before first meal of the day and Pepcid  (famotidine )  20 mg after supper until return to office - this is the best way to tell whether stomach acid is contributing to your problem.    My office will be contacting you by phone for referral to Modified barium swallow   - if you don't hear back from my office within one week please call us  back or notify us  thru MyChart and we'll address it right away.   Please remember to go to the lab department   for your tests - we will call you with the results when they are available.     Please remember to go to the  x-ray department  @  [facility] [place] for your tests - we will call you with the results when they are available       If you are satisfied with your treatment plan,  let your doctor know and he/she can either refill your medications or you can return here when your prescription runs out.     If in any way you are not 100% satisfied,  please tell us .  If 100% better, tell your friends!  Pulmonary follow up is as needed     Ozell America, MD 02/04/2024                     "

## 2024-02-05 ENCOUNTER — Other Ambulatory Visit: Payer: Self-pay

## 2024-02-05 ENCOUNTER — Ambulatory Visit: Payer: Self-pay | Admitting: Internal Medicine

## 2024-02-05 LAB — BASIC METABOLIC PANEL WITH GFR
BUN/Creatinine Ratio: 19 (ref 12–28)
BUN: 11 mg/dL (ref 8–27)
CO2: 22 mmol/L (ref 20–29)
Calcium: 8.3 mg/dL — ABNORMAL LOW (ref 8.7–10.3)
Chloride: 107 mmol/L — ABNORMAL HIGH (ref 96–106)
Creatinine, Ser: 0.59 mg/dL (ref 0.57–1.00)
Glucose: 81 mg/dL (ref 70–99)
Potassium: 3.4 mmol/L — ABNORMAL LOW (ref 3.5–5.2)
Sodium: 142 mmol/L (ref 134–144)
eGFR: 100 mL/min/{1.73_m2}

## 2024-02-05 MED ORDER — POTASSIUM CHLORIDE CRYS ER 20 MEQ PO TBCR: 40.0000 meq | EXTENDED_RELEASE_TABLET | Freq: Every day | ORAL | 0 refills | Status: AC

## 2024-02-05 NOTE — Telephone Encounter (Signed)
 Copied from CRM #[id number]. Topic: Clinical - Prescription Issue >> Feb 05, 2024  8:21 AM Leila BROCKS wrote: Reason for CRM: Patient [phone] states Dr. Chari nurse Mychart message to continue potassium pills, patient does not have any medications left. Please send to [facility] pharmacy.   [facility] [address], [place] -  [id number] [place] [address] [place] [address] Phone: [phone] Fax: [phone]  See mychart message and phone notes

## 2024-02-05 NOTE — Telephone Encounter (Signed)
 Pt states she only was given potassium pills for 10 day and has no more  Pls advise (I can inform her to contact pcp)

## 2024-02-12 ENCOUNTER — Other Ambulatory Visit (HOSPITAL_COMMUNITY): Payer: Self-pay | Admitting: Occupational Therapy

## 2024-02-12 DIAGNOSIS — R059 Cough, unspecified: Secondary | ICD-10-CM

## 2024-02-12 DIAGNOSIS — R1312 Dysphagia, oropharyngeal phase: Secondary | ICD-10-CM

## 2024-02-24 ENCOUNTER — Ambulatory Visit: Payer: Self-pay | Admitting: Nurse Practitioner

## 2024-02-26 ENCOUNTER — Ambulatory Visit (HOSPITAL_COMMUNITY): Admitting: Speech Pathology

## 2024-02-26 ENCOUNTER — Other Ambulatory Visit (HOSPITAL_COMMUNITY)

## 2024-04-24 ENCOUNTER — Ambulatory Visit: Payer: Self-pay

## 2024-11-17 ENCOUNTER — Inpatient Hospital Stay

## 2024-11-24 ENCOUNTER — Inpatient Hospital Stay: Admitting: Physician Assistant
